# Patient Record
Sex: Male | Born: 1945 | Race: Black or African American | Hispanic: No | Marital: Married | Smoking: Former smoker
Health system: Southern US, Community
[De-identification: ages and names within clinical notes are randomized; demographics above are authoritative.]

---

## 2003-08-20 ENCOUNTER — Ambulatory Visit (HOSPITAL_COMMUNITY): Admission: RE | Admit: 2003-08-20 | Discharge: 2003-08-20 | Payer: Self-pay | Admitting: Gastroenterology

## 2012-01-30 DIAGNOSIS — I10 Essential (primary) hypertension: Secondary | ICD-10-CM | POA: Insufficient documentation

## 2014-02-05 ENCOUNTER — Other Ambulatory Visit: Payer: Self-pay | Admitting: Internal Medicine

## 2014-02-05 ENCOUNTER — Ambulatory Visit
Admission: RE | Admit: 2014-02-05 | Discharge: 2014-02-05 | Disposition: A | Discharge status: Home / Self Care | Payer: Medicare Other | Source: Ambulatory Visit | Attending: Internal Medicine | Admitting: Internal Medicine

## 2014-02-05 DIAGNOSIS — R05 Cough: Secondary | ICD-10-CM

## 2014-02-05 DIAGNOSIS — R059 Cough, unspecified: Secondary | ICD-10-CM

## 2015-08-18 ENCOUNTER — Other Ambulatory Visit: Payer: Self-pay | Admitting: Internal Medicine

## 2015-08-18 ENCOUNTER — Ambulatory Visit
Admission: RE | Admit: 2015-08-18 | Discharge: 2015-08-18 | Disposition: A | Discharge status: Home / Self Care | Payer: Medicare Other | Source: Ambulatory Visit | Attending: Internal Medicine | Admitting: Internal Medicine

## 2015-08-18 DIAGNOSIS — R52 Pain, unspecified: Secondary | ICD-10-CM

## 2017-07-19 ENCOUNTER — Ambulatory Visit
Admission: RE | Admit: 2017-07-19 | Discharge: 2017-07-19 | Disposition: A | Discharge status: Home / Self Care | Payer: Medicare Other | Source: Ambulatory Visit | Attending: Internal Medicine | Admitting: Internal Medicine

## 2017-07-19 ENCOUNTER — Other Ambulatory Visit: Payer: Self-pay | Admitting: Internal Medicine

## 2017-07-19 DIAGNOSIS — M25562 Pain in left knee: Secondary | ICD-10-CM

## 2017-10-16 ENCOUNTER — Other Ambulatory Visit: Payer: Self-pay | Admitting: Internal Medicine

## 2017-10-16 ENCOUNTER — Ambulatory Visit
Admission: RE | Admit: 2017-10-16 | Discharge: 2017-10-16 | Disposition: A | Discharge status: Home / Self Care | Payer: Medicare Other | Source: Ambulatory Visit | Attending: Internal Medicine | Admitting: Internal Medicine

## 2017-10-16 DIAGNOSIS — T1490XA Injury, unspecified, initial encounter: Secondary | ICD-10-CM

## 2019-02-01 ENCOUNTER — Other Ambulatory Visit: Payer: Self-pay

## 2019-02-01 ENCOUNTER — Emergency Department (HOSPITAL_COMMUNITY): Payer: Medicare Other

## 2019-02-01 ENCOUNTER — Encounter (HOSPITAL_COMMUNITY): Payer: Self-pay

## 2019-02-01 ENCOUNTER — Emergency Department (HOSPITAL_COMMUNITY)
Admission: EM | Admit: 2019-02-01 | Discharge: 2019-02-01 | Disposition: A | Discharge status: Home / Self Care | Payer: Medicare Other | Attending: Emergency Medicine | Admitting: Emergency Medicine

## 2019-02-01 DIAGNOSIS — R000 Tachycardia, unspecified: Secondary | ICD-10-CM

## 2019-02-01 DIAGNOSIS — R079 Chest pain, unspecified: Secondary | ICD-10-CM | POA: Insufficient documentation

## 2019-02-01 LAB — BASIC METABOLIC PANEL
Anion gap: 10 (ref 5–15)
BUN: 17 mg/dL (ref 8–23)
CO2: 24 mmol/L (ref 22–32)
Calcium: 9.8 mg/dL (ref 8.9–10.3)
Chloride: 107 mmol/L (ref 98–111)
Creatinine, Ser: 1.17 mg/dL (ref 0.61–1.24)
GFR calc Af Amer: >60 mL/min (ref >60)
GFR calc non Af Amer: >60 mL/min (ref >60)
Glucose, Bld: 129 mg/dL — ABNORMAL HIGH (ref 70–99)
Potassium: 3.9 mmol/L (ref 3.5–5.1)
Sodium: 141 mmol/L (ref 135–145)

## 2019-02-01 LAB — CBC WITH DIFFERENTIAL/PLATELET
Abs Immature Granulocytes: 0.04 10*3/uL (ref 0.00–0.07)
Basophils Absolute: 0 10*3/uL (ref 0.0–0.1)
Basophils Relative: 0 %
Eosinophils Absolute: 0.1 10*3/uL (ref 0.0–0.5)
Eosinophils Relative: 1 %
HCT: 45.8 % (ref 39.0–52.0)
Hemoglobin: 14 g/dL (ref 13.0–17.0)
Immature Granulocytes: 0 %
Lymphocytes Relative: 19 %
Lymphs Abs: 2 10*3/uL (ref 0.7–4.0)
MCH: 29.2 pg (ref 26.0–34.0)
MCHC: 30.6 g/dL (ref 30.0–36.0)
MCV: 95.6 fL (ref 80.0–100.0)
Monocytes Absolute: 1.3 10*3/uL — ABNORMAL HIGH (ref 0.1–1.0)
Monocytes Relative: 12 %
Neutro Abs: 7.4 10*3/uL (ref 1.7–7.7)
Neutrophils Relative %: 68 %
Platelets: 204 10*3/uL (ref 150–400)
RBC: 4.79 MIL/uL (ref 4.22–5.81)
RDW: 13.6 % (ref 11.5–15.5)
WBC: 10.8 10*3/uL — ABNORMAL HIGH (ref 4.0–10.5)
nRBC: 0 % (ref 0.0–0.2)

## 2019-02-01 LAB — TROPONIN I (HIGH SENSITIVITY)
Troponin I (High Sensitivity): 45 ng/L — ABNORMAL HIGH (ref <18)
Troponin I (High Sensitivity): 39 ng/L — ABNORMAL HIGH (ref <18)

## 2019-02-01 MED ORDER — SODIUM CHLORIDE 0.9 % IV BOLUS
500.0000 mL | Freq: Once | INTRAVENOUS | Status: AC
  Administered 2019-02-01: 09:00:00 500 mL via INTRAVENOUS

## 2019-02-01 MED ORDER — IOHEXOL 350 MG/ML SOLN
75.0000 mL | Freq: Once | INTRAVENOUS | Status: AC | PRN
  Administered 2019-02-01: 75 mL via INTRAVENOUS

## 2019-02-01 MED ORDER — SODIUM CHLORIDE 0.9 % IV BOLUS
500.0000 mL | Freq: Once | INTRAVENOUS | Status: AC
  Administered 2019-02-01: 500 mL via INTRAVENOUS

## 2019-02-01 MED ORDER — ASPIRIN 81 MG PO CHEW
324.0000 mg | CHEWABLE_TABLET | Freq: Once | ORAL | Status: AC
  Administered 2019-02-01: 324 mg via ORAL
  Filled 2019-02-01: qty 4

## 2019-02-01 NOTE — Discharge Instructions (Signed)
It was strongly recommended that you stay in the hospital for further work-up of your chest pain.  We discussed that missed or delayed diagnosis of heart attack, heart disease, etc. could result in future heart attack, organ failure, and/or death.  If at any point you change your mind or get recurrent chest pain, please return to the hospital or call 911.  If you develop recurrent, continued, or worsening chest pain, shortness of breath, fever, vomiting, abdominal or back pain, or any other new/concerning symptoms then return to the ER for evaluation.

## 2019-02-01 NOTE — ED Triage Notes (Addendum)
This morning around 0100 pt started to have 8/10 upper chest pain that does not radiate. The pain has been consistent even after 500mg  tylenol. Denies N/V, SOB,  NAD, AxO 4.

## 2019-02-01 NOTE — ED Notes (Signed)
Patient transported to X-ray 

## 2019-02-01 NOTE — ED Provider Notes (Signed)
[facility] Provider Note   CSN: [id number] Arrival date & time: 02/01/19  0502    History   Chief Complaint Chief Complaint  Patient presents with  . Chest Pain    HPI Charles Munoz is a 73 y.o. male.  HPI: A 73 year old patient with a history of hypertension presents for evaluation of chest pain. Initial onset of pain was approximately 3-6 hours ago. The patient's chest pain is well-localized and is not worse with exertion. The patient's chest pain is middle- or left-sided, is not described as heaviness/pressure/tightness, is not sharp and does not radiate to the arms/jaw/neck. The patient does not complain of nausea and denies diaphoresis. The patient has no history of stroke, has no history of peripheral artery disease, has not smoked in the past 90 days, denies any history of treated diabetes, has no relevant family history of coronary artery disease (first degree relative at less than age 71), has no history of hypercholesterolemia and does not have an elevated BMI (>=30).   Patient presents to the ER for evaluation of chest pain.  Patient reports that the pain began at 1 AM.  Charles Munoz was not doing anything specifically when the pain started.  Charles Munoz reports a well-defined area in the left upper chest where the pain has been continuous since it started.  Charles Munoz has not identified anything that makes it better or worse.  Charles Munoz did take Tylenol without improvement.  No associated nausea, diaphoresis, shortness of breath.  No past history of heart problems.     History reviewed. No pertinent past medical history.  There are no active problems to display for this patient.   History reviewed. No pertinent surgical history.      Home Medications    Prior to Admission medications   Not on File    Family History History reviewed. No pertinent family history.  Social History Social History   Tobacco Use  . Smoking status: Not on file  Substance  Use Topics  . Alcohol use: Not on file  . Drug use: Not on file     Allergies   Patient has no allergy information on record.   Review of Systems Review of Systems  Cardiovascular: Positive for chest pain.  All other systems reviewed and are negative.    Physical Exam Updated Vital Signs BP (!) 146/91   Pulse (!) 113   Temp 98.5 F (36.9 C)   Resp (!) 29   Ht 6\' 2"  (1.88 m)   Wt 102.1 kg   SpO2 96%   BMI 28.89 kg/m   Physical Exam Vitals signs and nursing note reviewed.  Constitutional:      General: Charles Munoz is not in acute distress.    Appearance: Normal appearance. Charles Munoz is well-developed.  HENT:     Head: Normocephalic and atraumatic.     Right Ear: Hearing normal.     Left Ear: Hearing normal.     Nose: Nose normal.  Eyes:     Conjunctiva/sclera: Conjunctivae normal.     Pupils: Pupils are equal, round, and reactive to light.  Neck:     Musculoskeletal: Normal range of motion and neck supple.  Cardiovascular:     Rate and Rhythm: Regular rhythm.     Heart sounds: S1 normal and S2 normal. No murmur. No friction rub. No gallop.   Pulmonary:     Effort: Pulmonary effort is normal. No respiratory distress.     Breath sounds: Normal breath sounds.  Chest:  Chest wall: No tenderness.  Abdominal:     General: Bowel sounds are normal.     Palpations: Abdomen is soft.     Tenderness: There is no abdominal tenderness. There is no guarding or rebound. Negative signs include Murphy's sign and McBurney's sign.     Hernia: No hernia is present.  Musculoskeletal: Normal range of motion.  Skin:    General: Skin is warm and dry.     Findings: No rash.  Neurological:     Mental Status: Charles Munoz is alert and oriented to person, place, and time.     GCS: GCS eye subscore is 4. GCS verbal subscore is 5. GCS motor subscore is 6.     Cranial Nerves: No cranial nerve deficit.     Sensory: No sensory deficit.     Coordination: Coordination normal.  Psychiatric:        Speech:  Speech normal.        Behavior: Behavior normal.        Thought Content: Thought content normal.      ED Treatments / Results  Labs (all labs ordered are listed, but only abnormal results are displayed) Labs Reviewed  CBC WITH DIFFERENTIAL/PLATELET - Abnormal; Notable for the following components:      Result Value   WBC 10.8 (*)    Monocytes Absolute 1.3 (*)    All other components within normal limits  BASIC METABOLIC PANEL - Abnormal; Notable for the following components:   Glucose, Bld 129 (*)    All other components within normal limits  TROPONIN I (HIGH SENSITIVITY) - Abnormal; Notable for the following components:   Troponin I (High Sensitivity) 45 (*)    All other components within normal limits    EKG EKG Interpretation  Date/Time:  Friday February 01 2019 05:13:24 EDT Ventricular Rate:  126 PR Interval:    QRS Duration: 105 QT Interval:  316 QTC Calculation: 458 R Axis:   30 Text Interpretation:  Sinus tachycardia Borderline repolarization abnormality Baseline wander in lead(s) II III aVF V5 V6 No previous tracing Confirmed by Gilda CreasePollina, Magdelena Kinsella J [phone]([id number]) on 02/01/2019 5:45:33 AM   Radiology Dg Chest 2 View  Result Date: 02/01/2019 CLINICAL DATA:  Chest pain EXAM: CHEST - 2 VIEW COMPARISON:  10/16/2017 FINDINGS: Low volume chest with asymmetrically elevated left diaphragm and scarring/atelectasis at the bases. No edema, effusion, or pneumothorax likely normal heart size. IMPRESSION: Stable low volume chest with elevated left diaphragm and scarring/atelectasis. Electronically Signed   By: Marnee SpringJonathon  Watts M.D.   On: 02/01/2019 05:35    Procedures Procedures (including critical care time)  Medications Ordered in ED Medications  sodium chloride 0.9 % bolus 500 mL (has no administration in time range)  aspirin chewable tablet 324 mg (324 mg Oral Given 02/01/19 0520)     Initial Impression / Assessment and Plan / ED Course  I have reviewed the triage vital signs  and the nursing notes.  Pertinent labs & imaging results that were available during my care of the patient were reviewed by me and considered in my medical decision making (see chart for details).     HEAR Score: 4 Patient presents to the emergency department for evaluation of chest pain.  Patient had onset of pain at rest several hours ago.  Pain is not altered by movement or exertion.  It is not reproducible with palpation.  Patient does not have any history of heart disease.  His only cardiac risk factor is hypertension.  EKG shows  sinus tachycardia with nonspecific changes.  Based on this Charles Munoz has a heart score of 4.  Tachycardia is unexplained at this point.  We will therefore have a high suspicion for possible PE, perform CT angiography.  First troponin slightly elevated at 45.  CT pending, will sign out to oncoming ER physician.   Final Clinical Impressions(s) / ED Diagnoses   Final diagnoses:  Chest pain, unspecified type    ED Discharge Orders    None       Orpah Greek, MD 02/01/19 [phone]

## 2019-02-01 NOTE — ED Notes (Signed)
EDP at bedside  

## 2019-02-01 NOTE — ED Provider Notes (Signed)
Care transferred to me.  CTA negative.  He is still mildly tachycardic in the low 100s.  With his heart score, gray zone troponin, nonspecific ECG changes I recommended he be admitted to the hospital for observation and ACS work-up.  He understands what I am worried about and potential deleterious effects of not coming in but wants to go home.  He states he is feeling much better and the chest pain is minimal.  I stressed multiple times he should probably come in and we had a back-and-forth discussion but he still declines and wants to go home.  He understands return precautions and was encouraged to return if at any point he changes his mind or his symptoms continue or worsen.  Results for orders placed or performed during the hospital encounter of 02/01/19  CBC with Differential/Platelet  Result Value Ref Range   WBC 10.8 (H) 4.0 - 10.5 K/uL   RBC 4.79 4.22 - 5.81 MIL/uL   Hemoglobin 14.0 13.0 - 17.0 g/dL   HCT 29.545.8 62.139.0 - 30.852.0 %   MCV 95.6 80.0 - 100.0 fL   MCH 29.2 26.0 - 34.0 pg   MCHC 30.6 30.0 - 36.0 g/dL   RDW 65.713.6 84.611.5 - 96.215.5 %   Platelets 204 150 - 400 K/uL   nRBC 0.0 0.0 - 0.2 %   Neutrophils Relative % 68 %   Neutro Abs 7.4 1.7 - 7.7 K/uL   Lymphocytes Relative 19 %   Lymphs Abs 2.0 0.7 - 4.0 K/uL   Monocytes Relative 12 %   Monocytes Absolute 1.3 (H) 0.1 - 1.0 K/uL   Eosinophils Relative 1 %   Eosinophils Absolute 0.1 0.0 - 0.5 K/uL   Basophils Relative 0 %   Basophils Absolute 0.0 0.0 - 0.1 K/uL   Immature Granulocytes 0 %   Abs Immature Granulocytes 0.04 0.00 - 0.07 K/uL  Basic metabolic panel  Result Value Ref Range   Sodium 141 135 - 145 mmol/L   Potassium 3.9 3.5 - 5.1 mmol/L   Chloride 107 98 - 111 mmol/L   CO2 24 22 - 32 mmol/L   Glucose, Bld 129 (H) 70 - 99 mg/dL   BUN 17 8 - 23 mg/dL   Creatinine, Ser 9.521.17 0.61 - 1.24 mg/dL   Calcium 9.8 8.9 - 84.110.3 mg/dL   GFR calc non Af Amer >60 >60 mL/min   GFR calc Af Amer >60 >60 mL/min   Anion gap 10 5 - 15   Troponin I (High Sensitivity)  Result Value Ref Range   Troponin I (High Sensitivity) 45 (H) <18 ng/L  Troponin I (High Sensitivity)  Result Value Ref Range   Troponin I (High Sensitivity) 39 (H) <18 ng/L   Dg Chest 2 View  Result Date: 02/01/2019 CLINICAL DATA:  Chest pain EXAM: CHEST - 2 VIEW COMPARISON:  10/16/2017 FINDINGS: Low volume chest with asymmetrically elevated left diaphragm and scarring/atelectasis at the bases. No edema, effusion, or pneumothorax likely normal heart size. IMPRESSION: Stable low volume chest with elevated left diaphragm and scarring/atelectasis. Electronically Signed   By: Marnee SpringJonathon  Watts M.D.   On: 02/01/2019 05:35   Ct Angio Chest Pe W Or Wo Contrast  Result Date: 02/01/2019 CLINICAL DATA:  Nonradiating chest pain EXAM: CT ANGIOGRAPHY CHEST WITH CONTRAST TECHNIQUE: Multidetector CT imaging of the chest was performed using the standard protocol during bolus administration of intravenous contrast. Multiplanar CT image reconstructions and MIPs were obtained to evaluate the vascular anatomy. CONTRAST:  75mL OMNIPAQUE IOHEXOL 350  MG/ML SOLN COMPARISON:  Chest x-ray earlier today FINDINGS: Cardiovascular: Satisfactory opacification of the pulmonary arteries to the segmental level. No evidence of pulmonary embolism. Normal heart size. No pericardial effusion. Mediastinum/Nodes: Negative for adenopathy Lungs/Pleura: Very low volume chest with atelectasis and/or scarring at the bases. There is no edema, consolidation, effusion, or pneumothorax. Upper Abdomen: Negative Musculoskeletal: No acute or aggressive finding Review of the MIP images confirms the above findings. IMPRESSION: 1. Negative for pulmonary embolism or other acute finding. 2. Chronic low volume chest with asymmetric elevation of the left diaphragm. Question diaphragmatic weakness. Electronically Signed   By: Monte Fantasia M.D.   On: 02/01/2019 07:43      Sherwood Gambler, MD 02/01/19 [phone]

## 2019-03-13 ENCOUNTER — Other Ambulatory Visit: Payer: Self-pay | Admitting: Internal Medicine

## 2019-03-13 DIAGNOSIS — R260 Ataxic gait: Secondary | ICD-10-CM

## 2019-03-13 DIAGNOSIS — R42 Dizziness and giddiness: Secondary | ICD-10-CM

## 2019-03-15 ENCOUNTER — Ambulatory Visit
Admission: RE | Admit: 2019-03-15 | Discharge: 2019-03-15 | Disposition: A | Discharge status: Home / Self Care | Payer: Medicare Other | Source: Ambulatory Visit | Attending: Internal Medicine | Admitting: Internal Medicine

## 2019-03-15 ENCOUNTER — Other Ambulatory Visit: Payer: Self-pay

## 2019-03-15 DIAGNOSIS — R260 Ataxic gait: Secondary | ICD-10-CM

## 2019-03-15 DIAGNOSIS — R42 Dizziness and giddiness: Secondary | ICD-10-CM

## 2020-01-06 ENCOUNTER — Encounter: Payer: Self-pay | Admitting: Neurology

## 2020-01-06 ENCOUNTER — Other Ambulatory Visit: Payer: Self-pay

## 2020-01-06 ENCOUNTER — Ambulatory Visit: Payer: Medicare Other | Admitting: Neurology

## 2020-01-06 VITALS — BP 147/87 | HR 99 | Ht 74.0 in | Wt 220.0 lb

## 2020-01-06 DIAGNOSIS — F5104 Psychophysiologic insomnia: Secondary | ICD-10-CM | POA: Diagnosis not present

## 2020-01-06 DIAGNOSIS — R42 Dizziness and giddiness: Secondary | ICD-10-CM | POA: Diagnosis not present

## 2020-01-06 MED ORDER — TRAZODONE HCL 50 MG PO TABS: 50.0000 mg | ORAL_TABLET | Freq: Every evening | ORAL | 5 refills | Status: DC | PRN

## 2020-01-06 NOTE — Progress Notes (Signed)
SLEEP MEDICINE CLINIC    Provider:  Melvyn Novas, MD  Primary Care Physician:  Burton Apley, MD [address] [place] [address] [place] [address]     Referring Provider: Burton Apley, Md [address] [place],  [place] [address]          Chief Complaint according to patient   Patient presents with:    . New Patient (Initial Visit)     pt complains of not being able to sleep at night. he states that he averages about 3-4 hrs at night but even that is broken. unsure if snores in sleep. never had a SS completed. pt presents today stating that last aug he developed dizzy spells. states that when it first began it was intermittent but now its constant. He doesnt associate this with position changes. states that sometimes when he started walking he feels as if he is falling forward. Marland Kitchen       HISTORY OF PRESENT ILLNESS:  Lakoda Mcanany is a 74 year- old African American male patient and seen here upon a referral on 01/06/2020 from PCP.   Chief concern according to patient :  I can't sleep more than 3 hours at night, I am dizzy.    I have the pleasure of seeing Ariana Cavenaugh today, a right-handed  [place] male with a possible sleep disorder. He  has no past medical history on file.     Family medical /sleep history: no other family member on CPAP with OSA, insomnia, sleep walkers. nobody with MS, PD but a sister has vertigo.    Social history:  Patient is retired from [employer] and lives in a household alone. Family status is widowed with 3 adult children.  The patient currently used to work in day and night shifts.  Pets are present.Tobacco use- quit 17 years ago. ETOH use - socially  Caffeine intake in form of Coffee( none ) Soda( once in a while ) Tea ( none ) or energy drinks. Regular exercise in form of -none    Sleep habits are as follows: The patient's dinner time is between 6 PM. Dizziness is always present when he stands up , gets up - and his sleep has been  interrupted since his wife passed 2015. The patient goes to bed at 9-10 PM and watches TV  In the bedroom- he continues to sleep for 2-3 hours, wakes for unknown reason- not bathroom breaks.    The preferred sleep position is sideways , with the support of 2 pillows.  Dreams are reportedly rare.  7.30 AM is the usual rise time. The patient wakes up spontaneously.  He reports not feeling refreshed or restored in AM, Naps are not taken.    Review of Systems: Out of a complete 14 system review, the patient complains of only the following symptoms, and all other reviewed systems are negative.:  , fragmented sleep, Insomnia - chronic- dizziness since 02-2019- no trauma, no medical condition before. No tinnitus, no nausea.    How likely are you to doze in the following situations: 0 = not likely, 1 = slight chance, 2 = moderate chance, 3 = high chance   Sitting and Reading? Watching Television? Sitting inactive in a public place (theater or meeting)? As a passenger in a car for an hour without a break? Lying down in the afternoon when circumstances permit? Sitting and talking to someone? Sitting quietly after lunch without alcohol? In a car, while stopped for a few minutes  in traffic?   Total =7/ 24 points   FSS endorsed at 18/ 63 points.   Social History   Socioeconomic History  . Marital status: Married    Spouse name: Not on file  . Number of children: Not on file  . Years of education: Not on file  . Highest education level: Not on file  Occupational History  . Not on file  Tobacco Use  . Smoking status: Former Research scientist (life sciences)  . Smokeless tobacco: Never Used  . Tobacco comment: Quit 16 yrs ago  Substance and Sexual Activity  . Alcohol use: Yes    Comment: he will have some drinks on the week  . Drug use: Not Currently  . Sexual activity: Not on file  Other Topics Concern  . Not on file  Social History Narrative  . Not on file   Social Determinants of Health   Financial  Resource Strain:   . Difficulty of Paying Living Expenses:   Food Insecurity:   . Worried About Charity fundraiser in the Last Year:   . Arboriculturist in the Last Year:   Transportation Needs:   . Film/video editor (Medical):   Marland Kitchen Lack of Transportation (Non-Medical):   Physical Activity:   . Days of Exercise per Week:   . Minutes of Exercise per Session:   Stress:   . Feeling of Stress :   Social Connections:   . Frequency of Communication with Friends and Family:   . Frequency of Social Gatherings with Friends and Family:   . Attends Religious Services:   . Active Member of Clubs or Organizations:   . Attends Archivist Meetings:   Marland Kitchen Marital Status:     Family History  Problem Relation Age of Onset  . Migraines Sister     No past medical history on file.  No past surgical history on file.   Current Outpatient Medications on File Prior to Visit  Medication Sig Dispense Refill  . amLODipine (NORVASC) 5 MG tablet Take 5 mg by mouth daily.    Marland Kitchen lisinopril (ZESTRIL) 5 MG tablet Take 5 mg by mouth daily.    . metFORMIN (GLUCOPHAGE) 500 MG tablet Take 500 mg by mouth daily.    . sildenafil (REVATIO) 20 MG tablet Take 20 mg by mouth daily as needed (ED).     . simvastatin (ZOCOR) 20 MG tablet Take 20 mg by mouth daily.     No current facility-administered medications on file prior to visit.    No Known Allergies  Physical exam:  Today's Vitals   01/06/20 1352 01/06/20 1353 01/06/20 1354  BP: (!) 154/96 139/87 (!) 147/87  Pulse: 98 96 99  Weight: 220 lb (99.8 kg)    Height: 6\' 2"  (1.88 m)     Body mass index is 28.25 kg/m.   Wt Readings from Last 3 Encounters:  01/06/20 220 lb (99.8 kg)  02/01/19 225 lb (102.1 kg)     Ht Readings from Last 3 Encounters:  01/06/20 6\' 2"  (1.88 m)  02/01/19 6\' 2"  (1.88 m)     We obtained orthostatic BP: Supine blood pressure was 154/96 mmHg with a regular heart rate of 98 bpm  Seated blood pressure was  139/87 mmHg with a heart rate of 94 standing blood pressure was 147/87 with a heart rate of 99.  These are normal responses is a variability of less than 20 mmHg and a heart rate that adapted minimallly to the change .  HTN present today.     General: The patient is awake, alert and appears not in acute distress. The patient is well groomed. Head: Normocephalic, atraumatic. Neck is supple. Mallampati 3 plus ,  neck circumference:16.5  inches . Nasal airflow  patent.  Retrognathia is  seen. Scalloped tongue.   Cardiovascular:  Regular rate and cardiac rhythm by pulse,  without distended neck veins. Respiratory: Lungs are clear to auscultation.  Skin:  Without evidence of ankle edema, or rash. Trunk: The patient's posture is erect.   Neurologic exam : The patient is awake and alert, oriented to place and time.   Memory subjective described as intact.  Attention span & concentration ability appears normal.  Speech is fluent,  without  dysarthria, dysphonia or aphasia.  Mood and affect are appropriate.   Cranial nerves: no loss of smell or taste reported  Pupils are equal and briskly reactive to light. Funduscopic exam deferred. .  Extraocular movements in vertical and horizontal planes were intact and without nystagmus. No Diplopia. Visual fields by finger perimetry are intact. Hearing was intact to soft voice and finger rubbing.  Facial sensation intact to fine touch. Facial motor strength is symmetric and tongue and uvula move midline.  Neck ROM : rotation, tilt and flexion extension were normal for age and shoulder shrug was symmetrical.    Motor exam:  Symmetric bulk, tone and ROM.   Normal tone without cog wheeling, symmetric grip strength .   Sensory:  Fine touch, pinprick and vibration were tested  and  normal.  Proprioception tested in the upper extremities was normal.   Coordination: Rapid alternating movements in the fingers/hands were of normal speed.  The Finger-to-nose  maneuver was intact without evidence of ataxia, dysmetria or tremor.   Gait and station: Patient could rise unassisted from a seated position, walked without assistive device.  Stance is of normal width/ base and the patient turned with 3 steps.  Toe and heel walk were deferred.  Deep tendon reflexes: in the  upper and lower extremities are symmetric, brisk and intact.  Babinski response was deferred.        After spending a total time of  46  minutes face to face and additional time for physical and neurologic examination, review of laboratory studies,  personal review of imaging studies, reports and results of other testing and review of referral information / records as far as provided in visit, I have established the following assessments:  1) chronic insomnia, sleep habits are improvable- watch TV before you go to sleep, not in the bedroom.  2)  He is likely a snorer, given his airway anatomy and neck size.  3) dizziness can be related to heart rate fixation.  I did not see a betablocker on med list- unsure where this may have originated. Patient had  Not well hydrated - this may be a part of the problem.    My Plan is to proceed with:  1)  Hydration  2)  HST/ PSG  for apnea screening 3) chronic insomnia may imropve on trazodone.   The patient had no labs or imaging studies available to review on epic.  He had a CT head which returned reportedly negative. CT IMPRESSION: No acute intracranial normality.  Mild cortical atrophy.  I would like to obtain a MRI brain to evaluate cerebellum.    I would like to thank Burton Apley, MD and Burton Apley, Md [address] [place],  [place] [address] for allowing me  to meet with and to take care of this pleasant patient.   In short, Antwon Rochin will  follow up  through our NP within 3 month.   CC: I will share my notes with PCP   Electronically signed by: Melvyn Novas, MD 01/06/2020 2:19 PM  [facility]  and [facility] Board certified by The [employer] of Sleep Medicine and Diplomate of the [employer] of Sleep Medicine. Board certified In Neurology through the ABPN, Fellow of the [employer] of Neurology. Medical Director of [facility].

## 2020-01-06 NOTE — Addendum Note (Signed)
Addended by: Melvyn Novas on: 01/06/2020 02:51 PM   Modules accepted: Orders

## 2020-01-06 NOTE — Patient Instructions (Signed)
Dizziness Dizziness is a common problem. It is a feeling of unsteadiness or light-headedness. You may feel like you are about to faint. Dizziness can lead to injury if you stumble or fall. Anyone can become dizzy, but dizziness is more common in older adults. This condition can be caused by a number of things, including medicines, dehydration, or illness. Follow these instructions at home: Eating and drinking  Drink enough fluid to keep your urine clear or pale yellow. This helps to keep you from becoming dehydrated. Try to drink more clear fluids, such as water.  Do not drink alcohol.  Limit your caffeine intake if told to do so by your health care provider. Check ingredients and nutrition facts to see if a food or beverage contains caffeine.  Limit your salt (sodium) intake if told to do so by your health care provider. Check ingredients and nutrition facts to see if a food or beverage contains sodium. Activity  Avoid making quick movements. ? Rise slowly from chairs and steady yourself until you feel okay. ? In the morning, first sit up on the side of the bed. When you feel okay, stand slowly while you hold onto something until you know that your balance is fine.  If you need to stand in one place for a long time, move your legs often. Tighten and relax the muscles in your legs while you are standing.  Do not drive or use heavy machinery if you feel dizzy.  Avoid bending down if you feel dizzy. Place items in your home so that they are easy for you to reach without leaning over. Lifestyle  Do not use any products that contain nicotine or tobacco, such as cigarettes and e-cigarettes. If you need help quitting, ask your health care provider.  Try to reduce your stress level by using methods such as yoga or meditation. Talk with your health care provider if you need help to manage your stress. General instructions  Watch your dizziness for any changes.  Take over-the-counter and  prescription medicines only as told by your health care provider. Talk with your health care provider if you think that your dizziness is caused by a medicine that you are taking.  Tell a friend or a family member that you are feeling dizzy. If he or she notices any changes in your behavior, have this person call your health care provider.  Keep all follow-up visits as told by your health care provider. This is important. Contact a health care provider if:  Your dizziness does not go away.  Your dizziness or light-headedness gets worse.  You feel nauseous.  You have reduced hearing.  You have new symptoms.  You are unsteady on your feet or you feel like the room is spinning. Get help right away if:  You vomit or have diarrhea and are unable to eat or drink anything.  You have problems talking, walking, swallowing, or using your arms, hands, or legs.  You feel generally weak.  You are not thinking clearly or you have trouble forming sentences. It may take a friend or family member to notice this.  You have chest pain, abdominal pain, shortness of breath, or sweating.  Your vision changes.  You have any bleeding.  You have a severe headache.  You have neck pain or a stiff neck.  You have a fever. These symptoms may represent a serious problem that is an emergency. Do not wait to see if the symptoms will go away. Get medical help   right away. Call your local emergency services (911 in the [place] Do not drive yourself to the hospital. Summary  Dizziness is a feeling of unsteadiness or light-headedness. This condition can be caused by a number of things, including medicines, dehydration, or illness.  Anyone can become dizzy, but dizziness is more common in older adults.  Drink enough fluid to keep your urine clear or pale yellow. Do not drink alcohol.  Avoid making quick movements if you feel dizzy. Monitor your dizziness for any changes. This information is not intended to  replace advice given to you by your health care provider. Make sure you discuss any questions you have with your health care provider. Document Revised: 07/07/2017 Document Reviewed: 08/06/2016 Elsevier Patient Education  2020 [employer]. Quality Sleep Information, Adult Quality sleep is important for your mental and physical health. It also improves your quality of life. Quality sleep means you:  Are asleep for most of the time you are in bed.  Fall asleep within 30 minutes.  Wake up no more than once a night.  Are awake for no longer than 20 minutes if you do wake up during the night. Most adults need 7-8 hours of quality sleep each night. How can poor sleep affect me? If you do not get enough quality sleep, you may have:  Mood swings.  Daytime sleepiness.  Confusion.  Decreased reaction time.  Sleep disorders, such as insomnia and sleep apnea.  Difficulty with: ? Solving problems. ? Coping with stress. ? Paying attention. These issues may affect your performance and productivity at work, school, and at home. Lack of sleep may also put you at higher risk for accidents, suicide, and risky behaviors. If you do not get quality sleep you may also be at higher risk for several health problems, including:  Infections.  Type 2 diabetes.  Heart disease.  High blood pressure.  Obesity.  Worsening of long-term conditions, like arthritis, kidney disease, depression, Parkinson's disease, and epilepsy. What actions can I take to get more quality sleep?      Stick to a sleep schedule. Go to sleep and wake up at about the same time each day. Do not try to sleep less on weekdays and make up for lost sleep on weekends. This does not work.  Try to get about 30 minutes of exercise on most days. Do not exercise 2-3 hours before going to bed.  Limit naps during the day to 30 minutes or less.  Do not use any products that contain nicotine or tobacco, such as cigarettes or  e-cigarettes. If you need help quitting, ask your health care provider.  Do not drink caffeinated beverages for at least 8 hours before going to bed. Coffee, tea, and some sodas contain caffeine.  Do not drink alcohol close to bedtime.  Do not eat large meals close to bedtime.  Do not take naps in the late afternoon.  Try to get at least 30 minutes of sunlight every day. Morning sunlight is best.  Make time to relax before bed. Reading, listening to music, or taking a hot bath promotes quality sleep.  Make your bedroom a place that promotes quality sleep. Keep your bedroom dark, quiet, and at a comfortable room temperature. Make sure your bed is comfortable. Take out sleep distractions like TV, a computer, smartphone, and bright lights.  If you are lying awake in bed for longer than 20 minutes, get up and do a relaxing activity until you feel sleepy.  Work with your  health care provider to treat medical conditions that may affect sleeping, such as: ? Nasal obstruction. ? Snoring. ? Sleep apnea and other sleep disorders.  Talk to your health care provider if you think any of your prescription medicines may cause you to have difficulty falling or staying asleep.  If you have sleep problems, talk with a sleep consultant. If you think you have a sleep disorder, talk with your health care provider about getting evaluated by a specialist. Where to find more information  [place] website: https://sleepfoundation.org  National Heart, Lung, and [place] ([place]): http://www.saunders.info/.pdf  Centers for Disease Control and Prevention (CDC): LearningDermatology.pl Contact a health care provider if you:  Have trouble getting to sleep or staying asleep.  Often wake up very early in the morning and cannot get back to sleep.  Have daytime sleepiness.  Have daytime sleep attacks of suddenly falling asleep and sudden muscle  weakness (narcolepsy).  Have a tingling sensation in your legs with a strong urge to move your legs (restless legs syndrome).  Stop breathing briefly during sleep (sleep apnea).  Think you have a sleep disorder or are taking a medicine that is affecting your quality of sleep. Summary  Most adults need 7-8 hours of quality sleep each night.  Getting enough quality sleep is an important part of health and well-being.  Make your bedroom a place that promotes quality sleep and avoid things that may cause you to have poor sleep, such as alcohol, caffeine, smoking, and large meals.  Talk to your health care provider if you have trouble falling asleep or staying asleep. This information is not intended to replace advice given to you by your health care provider. Make sure you discuss any questions you have with your health care provider. Document Revised: 10/11/2017 Document Reviewed: 10/11/2017 Elsevier Patient Education  [place].

## 2020-01-07 ENCOUNTER — Telehealth: Payer: Self-pay | Admitting: Neurology

## 2020-01-07 LAB — COMPREHENSIVE METABOLIC PANEL
ALT: 22 IU/L (ref 0–44)
AST: 27 IU/L (ref 0–40)
Albumin/Globulin Ratio: 1.4 (ref 1.2–2.2)
Albumin: 4.3 g/dL (ref 3.7–4.7)
Alkaline Phosphatase: 74 IU/L (ref 48–121)
BUN/Creatinine Ratio: 16 (ref 10–24)
BUN: 21 mg/dL (ref 8–27)
Bilirubin Total: 0.6 mg/dL (ref 0.0–1.2)
CO2: 25 mmol/L (ref 20–29)
Calcium: 9.1 mg/dL (ref 8.6–10.2)
Chloride: 105 mmol/L (ref 96–106)
Creatinine, Ser: 1.29 mg/dL — ABNORMAL HIGH (ref 0.76–1.27)
GFR calc Af Amer: 64 mL/min/{1.73_m2} (ref >59)
GFR calc non Af Amer: 55 mL/min/{1.73_m2} — ABNORMAL LOW (ref >59)
Globulin, Total: 3.1 g/dL (ref 1.5–4.5)
Glucose: 103 mg/dL — ABNORMAL HIGH (ref 65–99)
Potassium: 5.1 mmol/L (ref 3.5–5.2)
Sodium: 142 mmol/L (ref 134–144)
Total Protein: 7.4 g/dL (ref 6.0–8.5)

## 2020-01-07 LAB — CBC WITH DIFFERENTIAL/PLATELET
Basophils Absolute: 0.1 10*3/uL (ref 0.0–0.2)
Basos: 1 %
EOS (ABSOLUTE): 0.1 10*3/uL (ref 0.0–0.4)
Eos: 1 %
Hematocrit: 41.9 % (ref 37.5–51.0)
Hemoglobin: 13.7 g/dL (ref 13.0–17.7)
Immature Grans (Abs): 0 10*3/uL (ref 0.0–0.1)
Immature Granulocytes: 0 %
Lymphocytes Absolute: 1.3 10*3/uL (ref 0.7–3.1)
Lymphs: 16 %
MCH: 29.1 pg (ref 26.6–33.0)
MCHC: 32.7 g/dL (ref 31.5–35.7)
MCV: 89 fL (ref 79–97)
Monocytes Absolute: 1 10*3/uL — ABNORMAL HIGH (ref 0.1–0.9)
Monocytes: 12 %
Neutrophils Absolute: 5.8 10*3/uL (ref 1.4–7.0)
Neutrophils: 70 %
Platelets: 232 10*3/uL (ref 150–450)
RBC: 4.7 x10E6/uL (ref 4.14–5.80)
RDW: 12.4 % (ref 11.6–15.4)
WBC: 8.2 10*3/uL (ref 3.4–10.8)

## 2020-01-07 NOTE — Telephone Encounter (Signed)
-----   Message from Melvyn Novas, MD sent at 01/07/2020 11:59 AM EDT ----- Normal CBC diff, increased creatinine level, but the reported reduced GFR has to be adjusted for AA race. He is not renal impaired based on that modifier.

## 2020-01-07 NOTE — Telephone Encounter (Signed)
[employer] medicare order sent to [facility] no auth.  01/06/20 1st attempt [facility] Epic Order [employer]

## 2020-01-07 NOTE — Telephone Encounter (Signed)
Called the patient to review the lab results. There was no answer LVM asking for the patient to call back since DPR not yet scanned.    If patient calls back please advise that lab work was in normal range. Dr Dohmeier didn't see anything concerning regarding the lab findings.

## 2020-01-07 NOTE — Progress Notes (Signed)
Normal CBC diff, increased creatinine level, but the reported reduced GFR has to be adjusted for AA race. He is not renal impaired based on that modifier.

## 2020-01-08 NOTE — Telephone Encounter (Signed)
FYI pt called and the message from [facility] was read to him.  Pt had no questions.

## 2020-01-13 ENCOUNTER — Telehealth: Payer: Self-pay

## 2020-01-13 NOTE — Telephone Encounter (Signed)
Called to schedule sleep study. 

## 2020-01-14 ENCOUNTER — Telehealth: Payer: Self-pay

## 2020-01-14 NOTE — Telephone Encounter (Signed)
Rec'd vm for pt needing to cancel 7/5 sleep study. LVM on 6/29 to reschedule that appt

## 2020-06-09 ENCOUNTER — Other Ambulatory Visit: Payer: Self-pay

## 2020-06-09 ENCOUNTER — Encounter (INDEPENDENT_AMBULATORY_CARE_PROVIDER_SITE_OTHER): Payer: Self-pay | Admitting: Otolaryngology

## 2020-06-09 ENCOUNTER — Ambulatory Visit (INDEPENDENT_AMBULATORY_CARE_PROVIDER_SITE_OTHER): Payer: Medicare Other | Admitting: Otolaryngology

## 2020-06-09 VITALS — Temp 96.6°F

## 2020-06-09 DIAGNOSIS — M270 Developmental disorders of jaws: Secondary | ICD-10-CM | POA: Diagnosis not present

## 2020-06-09 NOTE — Progress Notes (Signed)
HPI: Charles Munoz is a 74 y.o. male who presents is referred by his PCP for evaluation of nodules that have developed inside his mouth on the inside of the mandible.  He has had these for years but they have multiplied and gotten a little bit larger.  They are otherwise asymptomatic.Marland Kitchen  No past medical history on file. No past surgical history on file. Social History   Socioeconomic History  . Marital status: Married    Spouse name: Not on file  . Number of children: Not on file  . Years of education: Not on file  . Highest education level: Not on file  Occupational History  . Not on file  Tobacco Use  . Smoking status: Former Smoker    Quit date: 2001    Years since quitting: 20.9  . Smokeless tobacco: Never Used  . Tobacco comment: Quit 20years ago  Substance and Sexual Activity  . Alcohol use: Yes    Comment: he will have some drinks on the week  . Drug use: Not Currently  . Sexual activity: Not on file  Other Topics Concern  . Not on file  Social History Narrative  . Not on file   Social Determinants of Health   Financial Resource Strain:   . Difficulty of Paying Living Expenses: Not on file  Food Insecurity:   . Worried About Programme researcher, broadcasting/film/video in the Last Year: Not on file  . Ran Out of Food in the Last Year: Not on file  Transportation Needs:   . Lack of Transportation (Medical): Not on file  . Lack of Transportation (Non-Medical): Not on file  Physical Activity:   . Days of Exercise per Week: Not on file  . Minutes of Exercise per Session: Not on file  Stress:   . Feeling of Stress : Not on file  Social Connections:   . Frequency of Communication with Friends and Family: Not on file  . Frequency of Social Gatherings with Friends and Family: Not on file  . Attends Religious Services: Not on file  . Active Member of Clubs or Organizations: Not on file  . Attends Banker Meetings: Not on file  . Marital Status: Not on file   Family History   Problem Relation Age of Onset  . Migraines Sister    No Known Allergies Prior to Admission medications   Medication Sig Start Date End Date Taking? Authorizing Provider  amLODipine (NORVASC) 5 MG tablet Take 5 mg by mouth daily. 01/02/19  Yes [provider]  lisinopril (ZESTRIL) 5 MG tablet Take 5 mg by mouth daily. 01/02/19  Yes [provider]  metFORMIN (GLUCOPHAGE) 500 MG tablet Take 500 mg by mouth daily. 01/02/19  Yes [provider]  sildenafil (REVATIO) 20 MG tablet Take 20 mg by mouth daily as needed (ED).  09/30/18  Yes [provider]  simvastatin (ZOCOR) 20 MG tablet Take 20 mg by mouth daily. 01/02/19  Yes [provider]  traZODone (DESYREL) 50 MG tablet Take 1 tablet (50 mg total) by mouth at bedtime as needed for sleep. 01/06/20  Yes Dohmeier, Porfirio Mylar, MD     Positive ROS: Otherwise negative  All other systems have been reviewed and were otherwise negative with the exception of those mentioned in the HPI and as above.  Physical Exam: Constitutional: Alert, well-appearing, no acute distress Ears: External ears without lesions or tenderness. Ear canals are clear bilaterally with intact, clear TMs.  Nasal: External nose without lesions.Marland Kitchen  Clear nasal passages Oral: Lips and gums without lesions. Tongue and palate mucosa without lesions. Posterior oropharynx clear.  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  These are benign with normal mucosa overlying them. Neck: No palpable adenopathy or masses Respiratory: Breathing comfortably  Skin: No facial/neck lesions or rash noted.  Procedures  Assessment: Torus mandibularis.  These are benign and do not need to be removed unless they become symptomatic.  Plan: He will follow-up as needed.   Narda Bonds, MD   CC:

## 2020-08-20 ENCOUNTER — Telehealth: Payer: Self-pay | Admitting: Neurology

## 2020-08-20 ENCOUNTER — Ambulatory Visit: Payer: Medicare Other | Admitting: Neurology

## 2020-08-20 ENCOUNTER — Other Ambulatory Visit: Payer: Self-pay

## 2020-08-20 ENCOUNTER — Encounter: Payer: Self-pay | Admitting: Neurology

## 2020-08-20 VITALS — BP 138/82 | HR 84 | Ht 74.0 in | Wt 222.0 lb

## 2020-08-20 DIAGNOSIS — R2681 Unsteadiness on feet: Secondary | ICD-10-CM | POA: Diagnosis not present

## 2020-08-20 DIAGNOSIS — G609 Hereditary and idiopathic neuropathy, unspecified: Secondary | ICD-10-CM | POA: Diagnosis not present

## 2020-08-20 DIAGNOSIS — F5104 Psychophysiologic insomnia: Secondary | ICD-10-CM

## 2020-08-20 DIAGNOSIS — R42 Dizziness and giddiness: Secondary | ICD-10-CM | POA: Diagnosis not present

## 2020-08-20 MED ORDER — TRAZODONE HCL 50 MG PO TABS: 25.0000 mg | ORAL_TABLET | Freq: Every evening | ORAL | 0 refills | Status: DC | PRN

## 2020-08-20 NOTE — Telephone Encounter (Signed)
[employer] medicare order sent to [facility]. No auth they will reach out to the patient to schedule.  

## 2020-08-20 NOTE — Progress Notes (Signed)
SLEEP MEDICINE CLINIC    Provider:  Melvyn Novas, MD  Primary Care Physician:  Burton Apley, MD [address] [place] [address] [place] [address]     Referring Provider: Burton Apley, Md [address] [place],  [place] [address]          Chief Complaint according to patient   Patient presents with:    . New Patient (Initial Visit)     pt complains of not being able to sleep at night. he states that he averages about 3-4 hrs at night but even that is broken. unsure if snores in sleep. never had a SS completed. pt presents today stating that last aug he developed dizzy spells. states that when it first began it was intermittent but now its constant. He doesnt associate this with position changes. states that sometimes when he started walking he feels as if he is falling forward.   08-20-2020: Presents for follow up r/t dizziness he has had ongoing. Patient did not complete MRI or SS that were ordered in June. He does indicate the dizziness has worsened and states that only when standing/walking. No dizziness with sitting.       HISTORY OF PRESENT ILLNESS:  Charles Munoz is a 75 year- old African American male patient and seen here in a RV on  08/20/2020 ; The patient feels worse than when lat seen, more dizziness. Sleep aid has worked for him , but there were no refills, he never had the sleep study, never had the ordered MRI. He insists he had a "brain scan" through his regular doctor- I can't see any results in EPIC. Patient not sure what was done by who and when.   Dizziness gets worse when he starts walking, not when he rises.   .   Chief concern on 03-14-2020: Sleep Consultation, according to patient :  I can't sleep more than 3 hours at night, I am dizzy.    I have the pleasure of seeing Charles Munoz today, a right-handed  [place] male with a possible sleep disorder. He  has no past medical history on file. Family medical /sleep history: no other family member  on CPAP with OSA, insomnia, sleep walkers. nobody with MS, PD but a sister has vertigo.    Social history:  Patient is retired from [employer] and lives in a household alone. Family status is widowed with 3 adult children.  The patient currently used to work in day and night shifts.  Pets are present.Tobacco use- quit 17 years ago. ETOH use - socially  Caffeine intake in form of Coffee( none ) Soda( once in a while ) Tea ( none ) or energy drinks. Regular exercise in form of -none    Sleep habits are as follows: The patient's dinner time is between 6 PM. Dizziness is always present when he stands up , gets up - and his sleep has been interrupted since his wife passed 2015. The patient goes to bed at 9-10 PM and watches TV  In the bedroom- he continues to sleep for 2-3 hours, wakes for unknown reason- not bathroom breaks.    The preferred sleep position is sideways , with the support of 2 pillows.  Dreams are reportedly rare.  7.30 AM is the usual rise time. The patient wakes up spontaneously.  He reports not feeling refreshed or restored in AM, Naps are not taken.    Review of Systems: Out of a complete 14 system review, the patient  complains of only the following symptoms, and all other reviewed systems are negative.:  , fragmented sleep, Insomnia - chronic- dizziness since 02-2019- no trauma, no medical condition before. No tinnitus, no nausea.    How likely are you to doze in the following situations: 0 = not likely, 1 = slight chance, 2 = moderate chance, 3 = high chance   Sitting and Reading? Watching Television? Sitting inactive in a public place (theater or meeting)? As a passenger in a car for an hour without a break? Lying down in the afternoon when circumstances permit? Sitting and talking to someone? Sitting quietly after lunch without alcohol? In a car, while stopped for a few minutes in traffic?   Total =9/ 24 points   FSS endorsed at 21/ 63 points.   Social History    Socioeconomic History  . Marital status: Married    Spouse name: Not on file  . Number of children: Not on file  . Years of education: Not on file  . Highest education level: Not on file  Occupational History  . Not on file  Tobacco Use  . Smoking status: Former Smoker    Quit date: 2001    Years since quitting: 21.1  . Smokeless tobacco: Never Used  . Tobacco comment: Quit 20years ago  Substance and Sexual Activity  . Alcohol use: Yes    Comment: he will have some drinks on the week  . Drug use: Not Currently  . Sexual activity: Not on file  Other Topics Concern  . Not on file  Social History Narrative  . Not on file   Social Determinants of Health   Financial Resource Strain: Not on file  Food Insecurity: Not on file  Transportation Needs: Not on file  Physical Activity: Not on file  Stress: Not on file  Social Connections: Not on file    Family History  Problem Relation Age of Onset  . Migraines Sister     No past medical history on file.  No past surgical history on file.   Current Outpatient Medications on File Prior to Visit  Medication Sig Dispense Refill  . diltiazem (CARDIZEM CD) 120 MG 24 hr capsule Take 120 mg by mouth daily.    Marland Kitchen levothyroxine (SYNTHROID) 50 MCG tablet Take 50 mcg by mouth daily.    Marland Kitchen lisinopril (ZESTRIL) 5 MG tablet Take 5 mg by mouth daily.    . metFORMIN (GLUCOPHAGE) 500 MG tablet Take 500 mg by mouth daily.    . sildenafil (REVATIO) 20 MG tablet Take 20 mg by mouth daily as needed (ED).     . simvastatin (ZOCOR) 20 MG tablet Take 20 mg by mouth daily.     No current facility-administered medications on file prior to visit.    No Known Allergies  Physical exam:  Today's Vitals   08/20/20 1014  BP: 138/82  Pulse: 84  Weight: 222 lb (100.7 kg)  Height: 6\' 2"  (1.88 m)   Body mass index is 28.5 kg/m.   Wt Readings from Last 3 Encounters:  08/20/20 222 lb (100.7 kg)  01/06/20 220 lb (99.8 kg)  02/01/19 225 lb  (102.1 kg)     Ht Readings from Last 3 Encounters:  08/20/20 6\' 2"  (1.88 m)  01/06/20 6\' 2"  (1.88 m)  02/01/19 6\' 2"  (1.88 m)     We obtained orthostatic BP: 03-14-2020. Supine blood pressure was 154/96 mmHg with a regular heart rate of 98 bpm Seated blood pressure was 139/87 mmHg  with a heart rate of 94 standing blood pressure was 147/87 with a heart rate of 99.  These are normal responses is a variability of less than 20 mmHg and a heart rate that adapted minimallly to the change .  HTN present today.    General: The patient is awake, alert and appears not in acute distress. The patient is well groomed. Head: Normocephalic, atraumatic. Neck is supple. Mallampati 3 plus ,  neck circumference:16.5  inches . Nasal airflow  patent.  Retrognathia is seen. Scalloped tongue.   Cardiovascular:  Regular rate and cardiac rhythm by pulse,  without distended neck veins. Respiratory: Lungs are clear to auscultation.  Skin:  Without evidence of ankle edema, or rash. Trunk: The patient's posture is erect.   Neurologic exam : The patient is awake and alert, oriented to place and time.   Memory subjective described as impaired.   Attention span & concentration ability appears limited.  Speech is fluent,  without  dysarthria, dysphonia or aphasia.  Mood and affect are appropriate.   Cranial nerves: no loss of smell or taste reported  Pupils are equal and briskly reactive to light. Funduscopic exam without pallor or edema.   Extraocular movements in vertical and horizontal planes were intact and without nystagmus. No Diplopia. Visual fields by finger perimetry are intact. Hearing was intact to soft voice and finger rubbing.   Facial sensation intact to fine touch. Facial motor strength is symmetric . his tongue and uvula move midline.  Neck ROM : rotation, tilt and flexion extension were normal for age and shoulder shrug was symmetrical.    Motor exam:  Symmetric bulk, tone and ROM.   Normal  tone without cog wheeling, symmetric grip strength .   Sensory:  Fine touch and vibration were absent at the ankles and below.  Proprioception tested in the upper extremities was normal.   Coordination: Rapid alternating movements in the fingers/hands were of normal speed.  The Finger-to-nose maneuver was intact without evidence of ataxia, dysmetria or tremor.   Gait and station: Patient could rise unassisted from a seated position, walked without assistive device.  Stance is of normal width/ base and the patient turned with 3.5 steps. He felt swaying to the side as he turned , and took an extra step to stabilize.  Toe and heel walk were deferred.  Deep tendon reflexes: in the  upper and lower extremities are symmetric, brisk and intact.  Babinski response was deferred.        After spending a total time of 20  minutes face to face and additional time for physical and neurologic examination, review of laboratory studies,  personal review of imaging studies, reports and results of other testing and review of referral information / records as far as provided in visit, I have established the following assessments:  1) chronic insomnia, sleep habits are improvable- watch TV before you go to sleep, not in the bedroom. Trazodone refilled.  2)  He is likely a snorer, given his airway anatomy and neck size. I asked him again to be screened for OSA.  3) dizziness is not orthostatic when he feels off balance as he is already walking-  can be related to heart rate fixation. He has to hold on to rails when descending stairs.    Patient had today been well hydrated - and had normal HR and BP.   The patient had no labs or imaging studies available to review on epic.  He had a CT  head which returned reportedly negative. CT IMPRESSION: No acute intracranial normality.  Mild cortical atrophy.  I would still like to obtain a MRI brain to evaluate cerebellum and vertebro-basilar flow.  HST or PSG. Trazodone  50 mg prn. Referral to PT for gait stabilization.     I would like to thank Burton Apley, MD and Burton Apley, Md [address] [place],  [place] [address] for allowing me to meet with and to take care of this pleasant patient.   In short, Sian Joles will  follow up  through our NP within 3-4 month.   CC: I will share my notes with PCP   Electronically signed by: Melvyn Novas, MD 08/20/2020 10:35 AM  [facility] Board certified by The [employer] of Sleep Medicine and Diplomate of the [employer] of Sleep Medicine. Board certified In Neurology through the ABPN, Fellow of the [employer] of Neurology. Medical Director of [facility].

## 2020-08-20 NOTE — Patient Instructions (Signed)
Fall Prevention in the Home, Adult Falls can cause injuries and can happen to people of all ages. There are many things you can do to make your home safe and to help prevent falls. Ask for help when making these changes. What actions can I take to prevent falls? General Instructions  Use good lighting in all rooms. Replace any light bulbs that burn out.  Turn on the lights in dark areas. Use night-lights.  Keep items that you use often in easy-to-reach places. Lower the shelves around your home if needed.  Set up your furniture so you have a clear path. Avoid moving your furniture around.  Do not have throw rugs or other things on the floor that can make you trip.  Avoid walking on wet floors.  If any of your floors are uneven, fix them.  Add color or contrast paint or tape to clearly mark and help you see: ? Grab bars or handrails. ? First and last steps of staircases. ? Where the edge of each step is.  If you use a stepladder: ? Make sure that it is fully opened. Do not climb a closed stepladder. ? Make sure the sides of the stepladder are locked in place. ? Ask someone to hold the stepladder while you use it.  Know where your pets are when moving through your home. What can I do in the bathroom?  Keep the floor dry. Clean up any water on the floor right away.  Remove soap buildup in the tub or shower.  Use nonskid mats or decals on the floor of the tub or shower.  Attach bath mats securely with double-sided, nonslip rug tape.  If you need to sit down in the shower, use a plastic, nonslip stool.  Install grab bars by the toilet and in the tub and shower. Do not use towel bars as grab bars.      What can I do in the bedroom?  Make sure that you have a light by your bed that is easy to reach.  Do not use any sheets or blankets for your bed that hang to the floor.  Have a firm chair with side arms that you can use for support when you get dressed. What can I do in  the kitchen?  Clean up any spills right away.  If you need to reach something above you, use a step stool with a grab bar.  Keep electrical cords out of the way.  Do not use floor polish or wax that makes floors slippery. What can I do with my stairs?  Do not leave any items on the stairs.  Make sure that you have a light switch at the top and the bottom of the stairs.  Make sure that there are handrails on both sides of the stairs. Fix handrails that are broken or loose.  Install nonslip stair treads on all your stairs.  Avoid having throw rugs at the top or bottom of the stairs.  Choose a carpet that does not hide the edge of the steps on the stairs.  Check carpeting to make sure that it is firmly attached to the stairs. Fix carpet that is loose or worn. What can I do on the outside of my home?  Use bright outdoor lighting.  Fix the edges of walkways and driveways and fix any cracks.  Remove anything that might make you trip as you walk through a door, such as a raised step or threshold.  Trim any   bushes or trees on paths to your home.  Check to see if handrails are loose or broken and that both sides of all steps have handrails.  Install guardrails along the edges of any raised decks and porches.  Clear paths of anything that can make you trip, such as tools or rocks.  Have leaves, snow, or ice cleared regularly.  Use sand or salt on paths during winter.  Clean up any spills in your garage right away. This includes grease or oil spills. What other actions can I take?  Wear shoes that: ? Have a low heel. Do not wear high heels. ? Have rubber bottoms. ? Feel good on your feet and fit well. ? Are closed at the toe. Do not wear open-toe sandals.  Use tools that help you move around if needed. These include: ? Canes. ? Walkers. ? Scooters. ? Crutches.  Review your medicines with your doctor. Some medicines can make you feel dizzy. This can increase your chance  of falling. Ask your doctor what else you can do to help prevent falls. Where to find more information  Centers for Disease Control and Prevention, [employer]: FootballExhibition.com.br  [employer] on Aging: https://walker.com/ Contact a doctor if:  You are afraid of falling at home.  You feel weak, drowsy, or dizzy at home.  You fall at home. Summary  There are many simple things that you can do to make your home safe and to help prevent falls.  Ways to make your home safe include removing things that can make you trip and installing grab bars in the bathroom.  Ask for help when making these changes in your home. This information is not intended to replace advice given to you by your health care provider. Make sure you discuss any questions you have with your health care provider. Document Revised: 02/05/2020 Document Reviewed: 02/05/2020 Elsevier Patient Education  2021 Elsevier Inc. Trazodone Tablets What is this medicine? TRAZODONE (TRAZ oh done) is used to treat depression. This medicine may be used for other purposes; ask your health care provider or pharmacist if you have questions. COMMON BRAND NAME(S): Desyrel What should I tell my health care provider before I take this medicine? They need to know if you have any of these conditions:  attempted suicide or thinking about it  bipolar disorder  bleeding problems  glaucoma  heart disease, or previous heart attack  irregular heart beat  kidney or liver disease  low levels of sodium in the blood  an unusual or allergic reaction to trazodone, other medicines, foods, dyes or preservatives  pregnant or trying to get pregnant  breast-feeding How should I use this medicine? Take this medicine by mouth with a glass of water. Follow the directions on the prescription label. Take this medicine shortly after a meal or a light snack. Take your medicine at regular intervals. Do not take your medicine more often than directed. Do not  stop taking this medicine suddenly except upon the advice of your doctor. Stopping this medicine too quickly may cause serious side effects or your condition may worsen. A special MedGuide will be given to you by the pharmacist with each prescription and refill. Be sure to read this information carefully each time. Talk to your pediatrician regarding the use of this medicine in children. Special care may be needed. Overdosage: If you think you have taken too much of this medicine contact a poison control center or emergency room at once. NOTE: This medicine is only for  you. Do not share this medicine with others. What if I miss a dose? If you miss a dose, take it as soon as you can. If it is almost time for your next dose, take only that dose. Do not take double or extra doses. What may interact with this medicine? Do not take this medicine with any of the following medications:  certain medicines for fungal infections like fluconazole, itraconazole, ketoconazole, posaconazole, voriconazole  cisapride  dronedarone  linezolid  MAOIs like Carbex, Eldepryl, Marplan, Nardil, and Parnate  mesoridazine  methylene blue (injected into a vein)  pimozide  saquinavir  thioridazine This medicine may also interact with the following medications:  alcohol  antiviral medicines for HIV or AIDS  aspirin and aspirin-like medicines  barbiturates like phenobarbital  certain medicines for blood pressure, heart disease, irregular heart beat  certain medicines for depression, anxiety, or psychotic disturbances  certain medicines for migraine headache like almotriptan, eletriptan, frovatriptan, naratriptan, rizatriptan, sumatriptan, zolmitriptan  certain medicines for seizures like carbamazepine and phenytoin  certain medicines for sleep  certain medicines that treat or prevent blood clots like dalteparin, enoxaparin, warfarin  digoxin  fentanyl  lithium  NSAIDS, medicines for pain  and inflammation, like ibuprofen or naproxen  other medicines that prolong the QT interval (cause an abnormal heart rhythm) like dofetilide  rasagiline  supplements like St. John's wort, kava kava, valerian  tramadol  tryptophan This list may not describe all possible interactions. Give your health care provider a list of all the medicines, herbs, non-prescription drugs, or dietary supplements you use. Also tell them if you smoke, drink alcohol, or use illegal drugs. Some items may interact with your medicine. What should I watch for while using this medicine? Tell your doctor if your symptoms do not get better or if they get worse. Visit your doctor or health care professional for regular checks on your progress. Because it may take several weeks to see the full effects of this medicine, it is important to continue your treatment as prescribed by your doctor. Patients and their families should watch out for new or worsening thoughts of suicide or depression. Also watch out for sudden changes in feelings such as feeling anxious, agitated, panicky, irritable, hostile, aggressive, impulsive, severely restless, overly excited and hyperactive, or not being able to sleep. If this happens, especially at the beginning of treatment or after a change in dose, call your health care professional. Bonita Quin may get drowsy or dizzy. Do not drive, use machinery, or do anything that needs mental alertness until you know how this medicine affects you. Do not stand or sit up quickly, especially if you are an older patient. This reduces the risk of dizzy or fainting spells. Alcohol may interfere with the effect of this medicine. Avoid alcoholic drinks. This medicine may cause dry eyes and blurred vision. If you wear contact lenses you may feel some discomfort. Lubricating drops may help. See your eye doctor if the problem does not go away or is severe. Your mouth may get dry. Chewing sugarless gum, sucking hard candy and  drinking plenty of water may help. Contact your doctor if the problem does not go away or is severe. What side effects may I notice from receiving this medicine? Side effects that you should report to your doctor or health care professional as soon as possible:  allergic reactions like skin rash, itching or hives, swelling of the face, lips, or tongue  elevated mood, decreased need for sleep, racing thoughts, impulsive behavior  confusion  fast, irregular heartbeat  feeling faint or lightheaded, falls  feeling agitated, angry, or irritable  loss of balance or coordination  painful or prolonged erections  restlessness, pacing, inability to keep still  suicidal thoughts or other mood changes  tremors  trouble sleeping  seizures  unusual bleeding or bruising Side effects that usually do not require medical attention (report to your doctor or health care professional if they continue or are bothersome):  change in sex drive or performance  change in appetite or weight  constipation  headache  muscle aches or pains  nausea This list may not describe all possible side effects. Call your doctor for medical advice about side effects. You may report side effects to FDA at 1-800-FDA-1088. Where should I keep my medicine? Keep out of the reach of children. Store at room temperature between 15 and 30 degrees C (59 to 86 degrees F). Protect from light. Keep container tightly closed. Throw away any unused medicine after the expiration date. NOTE: This sheet is a summary. It may not cover all possible information. If you have questions about this medicine, talk to your doctor, pharmacist, or health care provider.  2021 Elsevier/Gold Standard (2020-05-25 14:46:11)

## 2020-08-30 ENCOUNTER — Other Ambulatory Visit: Payer: Self-pay

## 2020-08-30 ENCOUNTER — Ambulatory Visit
Admission: RE | Admit: 2020-08-30 | Discharge: 2020-08-30 | Disposition: A | Discharge status: Home / Self Care | Payer: Medicare Other | Source: Ambulatory Visit | Attending: Neurology | Admitting: Neurology

## 2020-08-30 DIAGNOSIS — R2681 Unsteadiness on feet: Secondary | ICD-10-CM

## 2020-08-30 DIAGNOSIS — G609 Hereditary and idiopathic neuropathy, unspecified: Secondary | ICD-10-CM

## 2020-08-30 DIAGNOSIS — F5104 Psychophysiologic insomnia: Secondary | ICD-10-CM

## 2020-08-30 DIAGNOSIS — R42 Dizziness and giddiness: Secondary | ICD-10-CM

## 2020-08-30 MED ORDER — GADOBENATE DIMEGLUMINE 529 MG/ML IV SOLN
20.0000 mL | Freq: Once | INTRAVENOUS | Status: AC | PRN
  Administered 2020-08-30: 20 mL via INTRAVENOUS

## 2020-08-31 ENCOUNTER — Encounter: Payer: Self-pay | Admitting: Neurology

## 2020-08-31 NOTE — Progress Notes (Signed)
IMPRESSION: This MRI of the brain with and without contrast shows the following: 1.   Mild generalized cortical atrophy 2.   Some scattered T2/FLAIR hyperintense foci in the hemispheres and pons consistent with mild chronic microvascular ischemic change. 3.   No acute findings. 4.   Normal enhancement pattern.   INTERPRETING PHYSICIAN:  Richard A. Sater, MD, PhD, FAAN Certified in  Neuroimaging by [employer] of Neuroimaging   This means we have no neurological focal abnormality, mild non focal atrophy, no abnormalities in the brain stem or vestibular nerves. 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    Cc Dr Su Hilt

## 2020-09-01 ENCOUNTER — Telehealth: Payer: Self-pay | Admitting: Neurology

## 2020-09-01 NOTE — Telephone Encounter (Signed)
Called the patient to review MRI of the brain results.  There was no answer.  Left voicemail advising patient to call back (no dpr to leave detailed message).   **When pt returns call, please advise the MRI of the brain was normal in appearance.  There was no sign of stroke, tumor, or bleeding on the brain.  The image did show tiny changes in the small vessels of the brain, this is common as we age and did not concern Dr. Vickey Huger.  Overall normal findings

## 2020-09-01 NOTE — Telephone Encounter (Signed)
-----   Message from Melvyn Novas, MD sent at 08/31/2020 12:39 PM EST ----- IMPRESSION: This MRI of the brain with and without contrast shows the following: 1.   Mild generalized cortical atrophy 2.   Some scattered T2/FLAIR hyperintense foci in the hemispheres and pons consistent with mild chronic microvascular ischemic change. 3.   No acute findings. 4.   Normal enhancement pattern.   INTERPRETING PHYSICIAN:  Richard A. Sater, MD, PhD, FAAN Certified in  Neuroimaging by [employer]   This means we have no neurological focal abnormality, mild non focal atrophy, no abnormalities in the brain stem or vestibular nerves. 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    Cc Dr Su Hilt

## 2020-09-07 ENCOUNTER — Telehealth: Payer: Self-pay | Admitting: Neurology

## 2020-09-07 NOTE — Telephone Encounter (Signed)
Hi casey  Yes that would be great . His referral was under Review . [phone] [id number].    Type Date User Summary Attachment  General 08/21/2020 12:06 PM Botello, Deisy R - -  Note   Therapist reviewed referral , routing to [facility]

## 2020-09-07 NOTE — Telephone Encounter (Signed)
Patient walked into the lobby this morning, stating he has not heard back from MRI results. He was I informed they tried to call him on the 15th but no answer. The results were read back to him via the telephone message in the chart. He understood the information but states he is still feeling dizzy and would like a call back. Best call back is [phone]

## 2020-09-07 NOTE — Telephone Encounter (Signed)
Called the patient back and advised that Dr Vickey Huger had placed a referral to the physical therapy for vestibular rehab.  Referral was placed on 2/4 and is under review.  Provided the phone number for the neuro rehab and advised the patient to reach out if he has not heard anything soon.  Patient verbalized understanding.  We also will be discussed the MRI results since I have him on the phone as well.

## 2020-09-08 ENCOUNTER — Other Ambulatory Visit: Payer: Self-pay

## 2020-09-08 ENCOUNTER — Ambulatory Visit: Payer: Medicare Other | Attending: Neurology

## 2020-09-08 VITALS — BP 130/82 | HR 84

## 2020-09-08 DIAGNOSIS — R2681 Unsteadiness on feet: Secondary | ICD-10-CM | POA: Insufficient documentation

## 2020-09-08 DIAGNOSIS — R2689 Other abnormalities of gait and mobility: Secondary | ICD-10-CM | POA: Insufficient documentation

## 2020-09-08 DIAGNOSIS — R42 Dizziness and giddiness: Secondary | ICD-10-CM | POA: Insufficient documentation

## 2020-09-09 NOTE — Therapy (Signed)
[facility] [address] [place], [place], [address] Phone: [phone]   Fax:  [phone]  Physical Therapy Evaluation  Patient Details  Name: Charles Munoz MRN: [id number] Date of Birth: 01-Jan-1946 Referring Provider (PT): Porfirio Mylararmen Dohmeier   Encounter Date: 09/08/2020   PT End of Session - 09/08/20 1316    Visit Number 1    Number of Visits 9    Date for PT Re-Evaluation 11/08/20    Authorization Type [employer] medicare so 10th visit progress note    PT Start Time 1314    PT Stop Time 1356    PT Time Calculation (min) 42 min    Equipment Utilized During Treatment Gait belt    Activity Tolerance Patient tolerated treatment well;Other (comment)   reported lightheaded with walking only   Behavior [facility] for tasks assessed/performed           History reviewed. No pertinent past medical history.  History reviewed. No pertinent surgical history.  Vitals:   09/08/20 1329 09/08/20 1331  BP: 138/88 130/82  Pulse: 78 84      Subjective Assessment - 09/08/20 1316    Subjective Pt was referred for Intermittent lightheadedness, Chronic insomnia, Idiopathic peripheral neuropathy, Gait instability. Pt reports that biggest issue is he gets lightheaded when he is up walking every time. He denies any syncopal episodes and no issues when he is sitting or lying. Denies any room spinning but reports the lightheaded feeling stays constant. Only PMH is HTN. Pt reports it started about a year ago.    Pertinent History HTN    Diagnostic tests MRI on 08/29/20 This MRI of the brain with and without contrast shows the following:  1.   Mild generalized cortical atrophy  2.   Some scattered T2/FLAIR hyperintense foci in the hemispheres and pons consistent with mild chronic microvascular ischemic change.  3.   No acute findings.  4.   Normal enhancement pattern.    Patient Stated Goals Pt would like to be able to walk and not feel  lightheaded.    Currently in Pain? No/denies              [facility] - 09/08/20 1320      Assessment   Medical Diagnosis gait instability    Referring Provider (PT) Porfirio Mylararmen Dohmeier    Onset Date/Surgical Date 08/20/20    Hand Dominance Right      Precautions   Precautions Fall      Balance Screen   Has the patient fallen in the past 6 months No    Has the patient had a decrease in activity level because of a fear of falling?  Yes    Is the patient reluctant to leave their home because of a fear of falling?  Yes      Home Environment   Living Environment Private residence    Living Arrangements Alone    Type of Home House    Home Access Level entry    Home Layout Two level;Able to live on main level with bedroom/bathroom    Alternate Level Stairs-Number of Steps 12    Alternate Level Stairs-Rails None    Home Equipment Cane - single point    Additional Comments pt goes outside the house and down 2 steps on porch if has to go to the basement      Prior Function   Level of Independence Independent;Independent with community mobility without device    Vocation  Retired      Copy Status Within Functional Limits for tasks assessed      Sensation   Light Touch Appears Intact      ROM / Strength   AROM / PROM / Strength Strength      Strength   Overall Strength Comments Pt grossly 5/5 throughout BLE and BUE in ankle DF, knee flex/ext, hip flexion, shoulder flexion, elbow flex/ext      Ambulation/Gait   Ambulation/Gait Yes    Ambulation/Gait Assistance 5: Supervision    Ambulation/Gait Assistance Details Pt occasionally staggers crossing feet over but able to regain balance on own.    Assistive device None    Gait Pattern Step-through pattern;Narrow base of support    Gait velocity 8.67 sec=1.71m/s    Stairs Yes    Stairs Assistance 5: Supervision    Stair Management Technique Two rails;Alternating pattern    Number of Stairs 4     Height of Stairs 6      Functional Gait  Assessment   Gait assessed  Yes    Gait Level Surface Walks 20 ft in less than 5.5 sec, no assistive devices, good speed, no evidence for imbalance, normal gait pattern, deviates no more than 6 in outside of the 12 in walkway width.    Change in Gait Speed Able to change speed, demonstrates mild gait deviations, deviates 6-10 in outside of the 12 in walkway width, or no gait deviations, unable to achieve a major change in velocity, or uses a change in velocity, or uses an assistive device.    Gait with Horizontal Head Turns Performs head turns smoothly with no change in gait. Deviates no more than 6 in outside 12 in walkway width    Gait with Vertical Head Turns Performs head turns with no change in gait. Deviates no more than 6 in outside 12 in walkway width.    Gait and Pivot Turn Pivot turns safely within 3 sec and stops quickly with no loss of balance.    Step Over Obstacle Is able to step over one shoe box (4.5 in total height) without changing gait speed. No evidence of imbalance.    Gait with Narrow Base of Support Ambulates 4-7 steps.    Gait with Eyes Closed Walks 20 ft, uses assistive device, slower speed, mild gait deviations, deviates 6-10 in outside 12 in walkway width. Ambulates 20 ft in less than 9 sec but greater than 7 sec.    Ambulating Backwards Walks 20 ft, uses assistive device, slower speed, mild gait deviations, deviates 6-10 in outside 12 in walkway width.    Steps Alternating feet, must use rail.    Total Score 23    FGA comment: BP=132/78 after. Pt with constant 9/10 lightheaded feeling throughout walking tasks that resides immediately with sitting.                  Vestibular Assessment - 09/08/20 1336      Symptom Behavior   Type of Dizziness  Lightheadedness    Frequency of Dizziness everytime he is walking is constant lightheaded feeling rating 9/10 but not to point of fainting.      Oculomotor Exam   Oculomotor  Alignment Normal    Smooth Pursuits Intact    Saccades Intact      Positional Sensitivities   Nose to Right Knee No dizziness    Nose to Left Knee No dizziness    Positional Sensitivities Comments pt denies any  dizziness with rolling in bed              Objective measurements completed on examination: See above findings.               PT Education - 09/09/20 0816    Education Details Pt instructed on PT plan of care. Will focus on higher level balance and gait stability.    Person(s) Educated Patient    Methods Explanation    Comprehension Verbalized understanding            PT Short Term Goals - 09/09/20 2878      PT SHORT TERM GOAL #1   Title Pt will be independent with HEP for balance to continue gains on own.    Time 4    Period Weeks    Status New    Target Date 10/09/20      PT SHORT TERM GOAL #2   Title Pt will increase FGA from 23 to 25/30 or more for improved balance and gait safety.    Baseline 09/08/20 23/30    Time 4    Period Weeks    Status New    Target Date 10/09/20             PT Long Term Goals - 09/09/20 0837      PT LONG TERM GOAL #1   Title Pt will ambulate >1000' on varied surfaces independently for improved community mobility.    Time 8    Period Weeks    Status New    Target Date 11/08/20      PT LONG TERM GOAL #2   Title Pt will increase FGA from 23 to 27/30 or better for improved balance and gait safety.    Baseline 09/08/20 23/30    Time 8    Period Weeks    Status New    Target Date 11/08/20      PT LONG TERM GOAL #3   Title Pt will report <6/10 "lightheaded" feeling with gait for improved function.    Baseline 9/10 constant with gait    Time 8    Period Weeks    Status New    Target Date 11/08/20                  Plan - 09/09/20 0818    Clinical Impression Statement Pt is 75 y/o male who presents with chief complaint of feeling lightheaded only when walking. With vestibular assessment no  dizziness reported with good tracking on eye movements. Orthostatics were assessed and no issues noted with good HR response as well. Pt's strength is grossly 5/5. He does have some noted staggering at times with gait with FGA score of 23/30 indicating moderate fall risk. Pt's "lightheaded" feeling with all gait activities stayed a constant 9/10 and resided immediately with sitting. Was not affected by and turning activities. Pt will benefit from skilled PT to focus on balance and gait.    Personal Factors and Comorbidities Comorbidity 1    Comorbidities HTN    Examination-Activity Limitations Locomotion Level;Stairs    Examination-Participation Restrictions Community Activity;Yard Work    Conservation officer, historic buildings Evolving/Moderate complexity    Clinical Decision Making Moderate    Rehab Potential Good    PT Frequency 1x / week   plus eval   PT Duration 8 weeks    PT Treatment/Interventions ADLs/Self Care Home Management;DME Instruction;Therapeutic activities;Functional mobility training;Stair training;Gait training;Therapeutic exercise;Balance training;Neuromuscular re-education;Vestibular;Canalith Repostioning;Manual techniques    PT Next Visit  Plan Assess 6 min walk monitoring vitals to see if any change. Gait activities with high level balance challenges to see if changes "lightheaded" feeling at all. Establish initial balance HEP.    Consulted and Agree with Plan of Care Patient           Patient will benefit from skilled therapeutic intervention in order to improve the following deficits and impairments:  Abnormal gait,Decreased balance,Dizziness  Visit Diagnosis: Other abnormalities of gait and mobility  Unsteadiness on feet  Dizziness and giddiness     Problem List Patient Active Problem List   Diagnosis Date Noted  . Intermittent lightheadedness 08/20/2020  . Gait instability 08/20/2020  . Idiopathic peripheral neuropathy 08/20/2020  . Chronic insomnia  08/20/2020    Ronn Melena, PT, DPT, NCS 09/09/2020, 8:40 AM  [facility] [address] [place], [place], [address] Phone: [phone]   Fax:  [phone]  Name: Charles Munoz MRN: [id number] Date of Birth: June 10, 1946

## 2020-09-15 ENCOUNTER — Other Ambulatory Visit: Payer: Self-pay

## 2020-09-15 ENCOUNTER — Ambulatory Visit: Payer: Medicare Other | Attending: Neurology

## 2020-09-15 DIAGNOSIS — M6281 Muscle weakness (generalized): Secondary | ICD-10-CM | POA: Diagnosis present

## 2020-09-15 DIAGNOSIS — R2681 Unsteadiness on feet: Secondary | ICD-10-CM | POA: Insufficient documentation

## 2020-09-15 DIAGNOSIS — R2689 Other abnormalities of gait and mobility: Secondary | ICD-10-CM | POA: Insufficient documentation

## 2020-09-15 NOTE — Therapy (Signed)
[facility] [address] [place], [place], [address] Phone: [phone]   Fax:  [phone]  Physical Therapy Treatment  Patient Details  Name: Charles Munoz MRN: [id number] Date of Birth: 09-Dec-1945 Referring Provider (PT): Porfirio Mylar Dohmeier   Encounter Date: 09/15/2020   PT End of Session - 09/15/20 1017    Visit Number 2    Number of Visits 9    Date for PT Re-Evaluation 11/08/20    Authorization Type [employer] medicare so 10th visit progress note    PT Start Time 1016    PT Stop Time 1056    PT Time Calculation (min) 40 min    Equipment Utilized During Treatment Gait belt    Activity Tolerance Patient tolerated treatment well;Other (comment)   reported lightheaded with walking only   Behavior During Therapy [facility] for tasks assessed/performed           History reviewed. No pertinent past medical history.  History reviewed. No pertinent surgical history.  There were no vitals filed for this visit.   Subjective Assessment - 09/15/20 1017    Subjective Pt denies any changes. Still lightheaded feeling only when walking.    Pertinent History HTN    Diagnostic tests MRI on 08/29/20 This MRI of the brain with and without contrast shows the following:  1.   Mild generalized cortical atrophy  2.   Some scattered T2/FLAIR hyperintense foci in the hemispheres and pons consistent with mild chronic microvascular ischemic change.  3.   No acute findings.  4.   Normal enhancement pattern.    Patient Stated Goals Pt would like to be able to walk and not feel lightheaded.    Currently in Pain? No/denies              [facility] PT Assessment - 09/15/20 1018      6 Minute Walk- Baseline   6 Minute Walk- Baseline yes   denies any lightheaded feeling at rest   BP (mmHg) 128/72    HR (bpm) 82    02 Sat (%RA) 99 %    Modified Borg Scale for Dyspnea 0- Nothing at all      6 Minute walk- Post Test   6 Minute Walk Post Test yes    BP  (mmHg) 158/88    HR (bpm) 118    02 Sat (%RA) 99 %    Modified Borg Scale for Dyspnea 0- Nothing at all      6 minute walk test results    Aerobic Endurance Distance Walked 1150    Endurance additional comments lightheaded feeling 7-8/10 throughout walking which resided after 15 sec of sitting.                         [facility] Adult PT Treatment/Exercise - 09/15/20 1018      Ambulation/Gait   Ambulation/Gait Yes    Ambulation/Gait Assistance 5: Supervision    Ambulation/Gait Assistance Details Ambulated during 6 min walk. Pt has lateral lean to right at times with right knee valgus noted.    Assistive device None    Gait Pattern Step-through pattern;Narrow base of support      Neuro Re-ed    Neuro Re-ed Details  In // bars: marching gait 8' x 6 with cues to squeeze bottom for improved SLS stability especially on right. Side stepping with green theraband around thighs 8' x 6 with cues for upright posture and to control steps. Step-ups on  6" step with green theraband around thighs x 10 with RLE without UE support then switched to step-ups on rockerboard positioned an/tpost with RLE x 5 then added in tapping cone with LLE each time to further increase right SLS. Visual cues in mirror to help maintain level pelvis. Pt reported feeling pretty good after activities with no lightheadedness.                  PT Education - 09/15/20 1221    Education Details Initiated balance HEP    Person(s) Educated Patient    Methods Explanation;Demonstration;Handout    Comprehension Verbalized understanding;Returned demonstration            PT Short Term Goals - 09/09/20 0832      PT SHORT TERM GOAL #1   Title Pt will be independent with HEP for balance to continue gains on own.    Time 4    Period Weeks    Status New    Target Date 10/09/20      PT SHORT TERM GOAL #2   Title Pt will increase FGA from 23 to 25/30 or more for improved balance and gait safety.    Baseline  09/08/20 23/30    Time 4    Period Weeks    Status New    Target Date 10/09/20             PT Long Term Goals - 09/09/20 0837      PT LONG TERM GOAL #1   Title Pt will ambulate >1000' on varied surfaces independently for improved community mobility.    Time 8    Period Weeks    Status New    Target Date 11/08/20      PT LONG TERM GOAL #2   Title Pt will increase FGA from 23 to 27/30 or better for improved balance and gait safety.    Baseline 09/08/20 23/30    Time 8    Period Weeks    Status New    Target Date 11/08/20      PT LONG TERM GOAL #3   Title Pt will report <6/10 "lightheaded" feeling with gait for improved function.    Baseline 9/10 constant with gait    Time 8    Period Weeks    Status New    Target Date 11/08/20                 Plan - 09/15/20 1222    Clinical Impression Statement Pt had normal vital response during 6 min walk testing today. Maintained a constant 7-8/10 lightheadedness throughout walk which resided quickly with sitting. Focused on dynamic balance activities with pt denying and lightheaded activity throughout. As pt tends to have some pelvic drop with right SLS also focused on more SLS activities.    Personal Factors and Comorbidities Comorbidity 1    Comorbidities HTN    Examination-Activity Limitations Locomotion Level;Stairs    Examination-Participation Restrictions Community Activity;Yard Work    Conservation officer, historic buildings Evolving/Moderate complexity    Rehab Potential Good    PT Frequency 1x / week   plus eval   PT Duration 8 weeks    PT Treatment/Interventions ADLs/Self Care Home Management;DME Instruction;Therapeutic activities;Functional mobility training;Stair training;Gait training;Therapeutic exercise;Balance training;Neuromuscular re-education;Vestibular;Canalith Repostioning;Manual techniques    PT Next Visit Plan Gait activities with high level balance, SLS activities on RLE. How is initial balance HEP going.  Add more balance activities to it next session.    Consulted and Agree  with Plan of Care Patient           Patient will benefit from skilled therapeutic intervention in order to improve the following deficits and impairments:  Abnormal gait,Decreased balance,Dizziness  Visit Diagnosis: Other abnormalities of gait and mobility  Muscle weakness (generalized)     Problem List Patient Active Problem List   Diagnosis Date Noted  . Intermittent lightheadedness 08/20/2020  . Gait instability 08/20/2020  . Idiopathic peripheral neuropathy 08/20/2020  . Chronic insomnia 08/20/2020    Ronn Melena , PT, DPT, NCS 09/15/2020, 12:25 PM  [facility] [address] [place], [place], [address] Phone: [phone]   Fax:  [phone]  Name: Jaimin Krupka MRN: [id number] Date of Birth: 03/14/1946

## 2020-09-15 NOTE — Patient Instructions (Signed)
Access Code: [email or web address] URL: [email or web address] Date: 09/15/2020 Prepared by: Elmer Bales  Exercises Walking March - 2 x daily - 7 x weekly - 1 sets - 3-4 reps Side Stepping with Resistance at [employer] and Counter Support - 2 x daily - 7 x weekly - 1 sets - 3-4 reps

## 2020-09-22 ENCOUNTER — Ambulatory Visit: Payer: Medicare Other

## 2020-09-22 ENCOUNTER — Other Ambulatory Visit: Payer: Self-pay

## 2020-09-22 DIAGNOSIS — R2689 Other abnormalities of gait and mobility: Secondary | ICD-10-CM

## 2020-09-22 DIAGNOSIS — R2681 Unsteadiness on feet: Secondary | ICD-10-CM

## 2020-09-22 NOTE — Therapy (Signed)
[facility] [address] [place], [place], [address] Phone: [phone]   Fax:  [phone]  Physical Therapy Treatment  Patient Details  Name: Charles Munoz MRN: [id number] Date of Birth: 12-03-1945 Referring Provider (PT): Porfirio Mylar Dohmeier   Encounter Date: 09/22/2020   PT End of Session - 09/22/20 1017    Visit Number 3    Number of Visits 9    Date for PT Re-Evaluation 11/08/20    Authorization Type [employer] medicare so 10th visit progress note    PT Start Time 1017    PT Stop Time 1057    PT Time Calculation (min) 40 min    Equipment Utilized During Treatment Gait belt    Activity Tolerance Patient tolerated treatment well;Other (comment)   reported lightheaded with walking only   Behavior During Therapy [facility] for tasks assessed/performed           History reviewed. No pertinent past medical history.  History reviewed. No pertinent surgical history.  There were no vitals filed for this visit.   Subjective Assessment - 09/22/20 1017    Subjective Pt reports he is still having the lightheaded feeling a little when walking and occasionally when laying down. He reports that symptoms have not been as constant with walking and sometimes less severe as well.    Pertinent History HTN    Diagnostic tests MRI on 08/29/20 This MRI of the brain with and without contrast shows the following:  1.   Mild generalized cortical atrophy  2.   Some scattered T2/FLAIR hyperintense foci in the hemispheres and pons consistent with mild chronic microvascular ischemic change.  3.   No acute findings.  4.   Normal enhancement pattern.    Patient Stated Goals Pt would like to be able to walk and not feel lightheaded.    Currently in Pain? No/denies                             [facility] Adult PT Treatment/Exercise - 09/22/20 1020      Ambulation/Gait   Ambulation/Gait Yes    Ambulation/Gait Assistance 5: Supervision     Ambulation/Gait Assistance Details around in clinic between activities    Assistive device None    Gait Pattern Step-through pattern;Narrow base of support      Neuro Re-ed    Neuro Re-ed Details  At counter: marching gait without UE support 8' x 6, tandem gait 8' x 6, over blue mat: side stepping 8' x 6, reciprocal steps over 3 cones tapping cone first for increased SLS time x 8 bouts. Side stepping over 3 cones on blue mat x 6 bouts. Alternating toe taps on cone x 10 with cues to tighten bottom for erect posture especially on right. On blue mat: tandem stance 30 sec x 2 each position. Pt had to touch occasionally. Less stable with right foot posterior. Standing with feet together eyes closed with head turn left/right x 10. No instability of dizziness noted. Gait over obstacle coures: reciprocal steps over 4 varied height orange hurdles, weaving in and out of 4 cones x 2 laps through then side stepping over hurdles and reciprocal steps over cones with tapping first to increase SLS time CGA.                  PT Education - 09/22/20 1049    Education Details Pt reporting sore 3rd toe on right foot and may have  [place] toenail. Suggested seeing podiatrist if does not improve. Added to HEP    Person(s) Educated Patient    Methods Explanation;Demonstration;Handout    Comprehension Verbalized understanding;Returned demonstration            PT Short Term Goals - 09/09/20 9563      PT SHORT TERM GOAL #1   Title Pt will be independent with HEP for balance to continue gains on own.    Time 4    Period Weeks    Status New    Target Date 10/09/20      PT SHORT TERM GOAL #2   Title Pt will increase FGA from 23 to 25/30 or more for improved balance and gait safety.    Baseline 09/08/20 23/30    Time 4    Period Weeks    Status New    Target Date 10/09/20             PT Long Term Goals - 09/09/20 0837      PT LONG TERM GOAL #1   Title Pt will ambulate >1000' on varied surfaces  independently for improved community mobility.    Time 8    Period Weeks    Status New    Target Date 11/08/20      PT LONG TERM GOAL #2   Title Pt will increase FGA from 23 to 27/30 or better for improved balance and gait safety.    Baseline 09/08/20 23/30    Time 8    Period Weeks    Status New    Target Date 11/08/20      PT LONG TERM GOAL #3   Title Pt will report <6/10 "lightheaded" feeling with gait for improved function.    Baseline 9/10 constant with gait    Time 8    Period Weeks    Status New    Target Date 11/08/20                 Plan - 09/22/20 1202    Clinical Impression Statement Pt reporting some improvement in lightheaded symptoms since starting therapy as is now not all the time when walking. He denied any symptoms with balance activities today. PT continued to progress activities with more SLS. Most challenged on right.    Personal Factors and Comorbidities Comorbidity 1    Comorbidities HTN    Examination-Activity Limitations Locomotion Level;Stairs    Examination-Participation Restrictions Community Activity;Yard Work    Conservation officer, historic buildings Evolving/Moderate complexity    Rehab Potential Good    PT Frequency 1x / week   plus eval   PT Duration 8 weeks    PT Treatment/Interventions ADLs/Self Care Home Management;DME Instruction;Therapeutic activities;Functional mobility training;Stair training;Gait training;Therapeutic exercise;Balance training;Neuromuscular re-education;Vestibular;Canalith Repostioning;Manual techniques    PT Next Visit Plan Gait activities with high level balance, SLS activities on RLE.    Consulted and Agree with Plan of Care Patient           Patient will benefit from skilled therapeutic intervention in order to improve the following deficits and impairments:  Abnormal gait,Decreased balance,Dizziness  Visit Diagnosis: Other abnormalities of gait and mobility  Unsteadiness on feet     Problem  List Patient Active Problem List   Diagnosis Date Noted  . Intermittent lightheadedness 08/20/2020  . Gait instability 08/20/2020  . Idiopathic peripheral neuropathy 08/20/2020  . Chronic insomnia 08/20/2020    Ronn Melena, PT, DPT, NCS 09/22/2020, 12:04 PM  [facility] [phone]  [address] [place], [place], [address] Phone: [phone]   Fax:  [phone]  Name: Charles Munoz MRN: [id number] Date of Birth: [birth year]

## 2020-09-22 NOTE — Patient Instructions (Signed)
Access Code: [email or web address] URL: [email or web address] Date: 09/22/2020 Prepared by: Elmer Bales  Exercises Walking March - 2 x daily - 7 x weekly - 1 sets - 3-4 reps Side Stepping with Resistance at [employer] and Counter Support - 2 x daily - 7 x weekly - 1 sets - 3-4 reps Tandem Walking with Counter Support - 2 x daily - 7 x weekly - 1 sets - 3-4 reps Standing Tandem Balance with Counter Support - 2 x daily - 7 x weekly - 1 sets - 3 reps - 30 sec hold

## 2020-09-29 ENCOUNTER — Ambulatory Visit: Payer: Medicare Other

## 2020-09-29 ENCOUNTER — Other Ambulatory Visit: Payer: Self-pay

## 2020-09-29 VITALS — BP 148/90

## 2020-09-29 DIAGNOSIS — R2689 Other abnormalities of gait and mobility: Secondary | ICD-10-CM | POA: Diagnosis not present

## 2020-09-29 DIAGNOSIS — R2681 Unsteadiness on feet: Secondary | ICD-10-CM

## 2020-09-29 NOTE — Therapy (Signed)
[facility] [address] [place], [place], [address] Phone: [phone]   Fax:  [phone]  Physical Therapy Treatment  Patient Details  Name: Charles Munoz MRN: [id number] Date of Birth: April 04, 1946 Referring Provider (PT): Porfirio Mylar Dohmeier   Encounter Date: 09/29/2020   PT End of Session - 09/29/20 1018    Visit Number 4    Number of Visits 9    Date for PT Re-Evaluation 11/08/20    Authorization Type [employer] medicare so 10th visit progress note    PT Start Time 1017    PT Stop Time 1056    PT Time Calculation (min) 39 min    Equipment Utilized During Treatment Gait belt    Activity Tolerance Patient tolerated treatment well;Other (comment)   reported lightheaded with walking only   Behavior During Therapy [facility] for tasks assessed/performed           History reviewed. No pertinent past medical history.  History reviewed. No pertinent surgical history.  Vitals:   09/29/20 1026 09/29/20 1029  BP: (!) 148/90 (!) 148/90     Subjective Assessment - 09/29/20 1019    Subjective Pt reports that he is still having the dizzy spells when he is walking. He also gets them when he lies down. Reports is not room spinning, just lightheaded feeling still.    Pertinent History HTN    Diagnostic tests MRI on 08/29/20 This MRI of the brain with and without contrast shows the following:  1.   Mild generalized cortical atrophy  2.   Some scattered T2/FLAIR hyperintense foci in the hemispheres and pons consistent with mild chronic microvascular ischemic change.  3.   No acute findings.  4.   Normal enhancement pattern.    Patient Stated Goals Pt would like to be able to walk and not feel lightheaded.    Currently in Pain? No/denies                   Vestibular Assessment - 09/29/20 0001      Positional Testing   Dix-Hallpike Dix-Hallpike Right;Dix-Hallpike Left    Horizontal Canal Testing Horizontal Canal Right;Horizontal  Canal Left      Dix-Hallpike Right   Dix-Hallpike Right Symptoms No nystagmus      Dix-Hallpike Left   Dix-Hallpike Left Symptoms No nystagmus      Horizontal Canal Right   Horizontal Canal Right Symptoms Normal      Horizontal Canal Left   Horizontal Canal Left Symptoms Normal                    [facility] Adult PT Treatment/Exercise - 09/29/20 1030      Ambulation/Gait   Ambulation/Gait Yes    Ambulation/Gait Assistance 5: Supervision    Ambulation/Gait Assistance Details Pt was cued to focus on foot placement and try to stay up tall. Occasional stagger to the right. At end of session pt ambulated 230' with focus on foot placement and engaging core more. Noted improving in gait quality with no staggering during this.    Ambulation Distance (Feet) 850 Feet   230' inside at end   Assistive device None    Gait Pattern Step-through pattern;Narrow base of support    Ambulation Surface Level;Unlevel;Outdoor;Paved;Grass      Neuro Re-ed    Neuro Re-ed Details  Reciprocal stepping over floor ladder near counter x 2 bouts then with marching walk x 6 bouts. As pt fatigued he had more episodes of crossing over  needing occasional CGA/min assist to regain. After seated rest break, performed side stepping over floor ladder without UE support x 6. Tapping 3 cones on command x 10 each leg then alternating toe taps x 10 without UE support.                  PT Education - 09/29/20 1826    Education Details Pt to focus on foot placement with gait.    Person(s) Educated Patient    Comprehension Verbalized understanding            PT Short Term Goals - 09/09/20 9233      PT SHORT TERM GOAL #1   Title Pt will be independent with HEP for balance to continue gains on own.    Time 4    Period Weeks    Status New    Target Date 10/09/20      PT SHORT TERM GOAL #2   Title Pt will increase FGA from 23 to 25/30 or more for improved balance and gait safety.    Baseline 09/08/20  23/30    Time 4    Period Weeks    Status New    Target Date 10/09/20             PT Long Term Goals - 09/09/20 0837      PT LONG TERM GOAL #1   Title Pt will ambulate >1000' on varied surfaces independently for improved community mobility.    Time 8    Period Weeks    Status New    Target Date 11/08/20      PT LONG TERM GOAL #2   Title Pt will increase FGA from 23 to 27/30 or better for improved balance and gait safety.    Baseline 09/08/20 23/30    Time 8    Period Weeks    Status New    Target Date 11/08/20      PT LONG TERM GOAL #3   Title Pt will report <6/10 "lightheaded" feeling with gait for improved function.    Baseline 9/10 constant with gait    Time 8    Period Weeks    Status New    Target Date 11/08/20                 Plan - 09/29/20 1828    Clinical Impression Statement PT assessed positional vestibular tests and pt was negative. No nystagmus and no symptoms. Pt reports continued lightheaded feeling with gait mostly but occaionally when lies down now. Minimal complaints during session today. BP remains good. Continued to focus more on improving foot placement with balance activities as pt does stagger at times to the right but improving.    Personal Factors and Comorbidities Comorbidity 1    Comorbidities HTN    Examination-Activity Limitations Locomotion Level;Stairs    Examination-Participation Restrictions Community Activity;Yard Work    Conservation officer, historic buildings Evolving/Moderate complexity    Rehab Potential Good    PT Frequency 1x / week   plus eval   PT Duration 8 weeks    PT Treatment/Interventions ADLs/Self Care Home Management;DME Instruction;Therapeutic activities;Functional mobility training;Stair training;Gait training;Therapeutic exercise;Balance training;Neuromuscular re-education;Vestibular;Canalith Repostioning;Manual techniques    PT Next Visit Plan Check STGs. Bufford Lope- I wondered if you had any other ideas on why he is  "lightheaded" with gait. BP has been good, negative on positional testing. I have been unable to reproduce his symptoms other than just when walking. Balance activities with working on improving  SLS time.    Consulted and Agree with Plan of Care Patient           Patient will benefit from skilled therapeutic intervention in order to improve the following deficits and impairments:  Abnormal gait,Decreased balance,Dizziness  Visit Diagnosis: Other abnormalities of gait and mobility  Unsteadiness on feet     Problem List Patient Active Problem List   Diagnosis Date Noted  . Intermittent lightheadedness 08/20/2020  . Gait instability 08/20/2020  . Idiopathic peripheral neuropathy 08/20/2020  . Chronic insomnia 08/20/2020    Ronn Melena, PT, DPT, NCS 09/29/2020, 6:34 PM  [facility] [address] [place], [place], [address] Phone: [phone]   Fax:  [phone]  Name: Charles Munoz MRN: [id number] Date of Birth: Oct 01, 1945

## 2020-10-06 ENCOUNTER — Ambulatory Visit: Payer: Medicare Other | Admitting: Physical Therapy

## 2020-10-13 ENCOUNTER — Ambulatory Visit: Payer: Medicare Other

## 2020-10-20 ENCOUNTER — Ambulatory Visit: Payer: Medicare Other

## 2020-11-24 ENCOUNTER — Other Ambulatory Visit: Payer: Self-pay | Admitting: Neurology

## 2021-02-23 ENCOUNTER — Ambulatory Visit: Payer: Medicare Other | Admitting: Adult Health

## 2021-06-21 ENCOUNTER — Emergency Department (HOSPITAL_COMMUNITY)
Admission: EM | Admit: 2021-06-21 | Discharge: 2021-06-21 | Disposition: A | Discharge status: Home / Self Care | Payer: Medicare Other | Attending: Emergency Medicine | Admitting: Emergency Medicine

## 2021-06-21 ENCOUNTER — Emergency Department (HOSPITAL_COMMUNITY): Payer: Medicare Other

## 2021-06-21 DIAGNOSIS — Z87891 Personal history of nicotine dependence: Secondary | ICD-10-CM | POA: Insufficient documentation

## 2021-06-21 DIAGNOSIS — R42 Dizziness and giddiness: Secondary | ICD-10-CM | POA: Insufficient documentation

## 2021-06-21 LAB — CBC WITH DIFFERENTIAL/PLATELET
Abs Immature Granulocytes: 0.01 10*3/uL (ref 0.00–0.07)
Basophils Absolute: 0.1 10*3/uL (ref 0.0–0.1)
Basophils Relative: 1 %
Eosinophils Absolute: 0.1 10*3/uL (ref 0.0–0.5)
Eosinophils Relative: 2 %
HCT: 47.2 % (ref 39.0–52.0)
Hemoglobin: 14.6 g/dL (ref 13.0–17.0)
Immature Granulocytes: 0 %
Lymphocytes Relative: 24 %
Lymphs Abs: 1.5 10*3/uL (ref 0.7–4.0)
MCH: 29.3 pg (ref 26.0–34.0)
MCHC: 30.9 g/dL (ref 30.0–36.0)
MCV: 94.6 fL (ref 80.0–100.0)
Monocytes Absolute: 0.9 10*3/uL (ref 0.1–1.0)
Monocytes Relative: 14 %
Neutro Abs: 3.8 10*3/uL (ref 1.7–7.7)
Neutrophils Relative %: 59 %
Platelets: 213 10*3/uL (ref 150–400)
RBC: 4.99 MIL/uL (ref 4.22–5.81)
RDW: 13.1 % (ref 11.5–15.5)
WBC: 6.4 10*3/uL (ref 4.0–10.5)
nRBC: 0 % (ref 0.0–0.2)

## 2021-06-21 LAB — COMPREHENSIVE METABOLIC PANEL
ALT: 24 U/L (ref 0–44)
AST: 26 U/L (ref 15–41)
Albumin: 3.5 g/dL (ref 3.5–5.0)
Alkaline Phosphatase: 63 U/L (ref 38–126)
Anion gap: 5 (ref 5–15)
BUN: 12 mg/dL (ref 8–23)
CO2: 28 mmol/L (ref 22–32)
Calcium: 9.2 mg/dL (ref 8.9–10.3)
Chloride: 103 mmol/L (ref 98–111)
Creatinine, Ser: 1.17 mg/dL (ref 0.61–1.24)
GFR, Estimated: >60 mL/min (ref >60)
Glucose, Bld: 118 mg/dL — ABNORMAL HIGH (ref 70–99)
Potassium: 3.8 mmol/L (ref 3.5–5.1)
Sodium: 136 mmol/L (ref 135–145)
Total Bilirubin: 0.5 mg/dL (ref 0.3–1.2)
Total Protein: 7 g/dL (ref 6.5–8.1)

## 2021-06-21 LAB — MAGNESIUM: Magnesium: 2.1 mg/dL (ref 1.7–2.4)

## 2021-06-21 MED ORDER — MECLIZINE HCL 12.5 MG PO TABS: 12.5000 mg | ORAL_TABLET | Freq: Three times a day (TID) | ORAL | 0 refills | Status: DC | PRN

## 2021-06-21 NOTE — ED Triage Notes (Signed)
Pt. Stated, Charles Munoz been here cause of dizziness for the last 3 years everyday.

## 2021-06-21 NOTE — ED Provider Notes (Signed)
Emergency Medicine Provider Triage Evaluation Note  Charles Munoz , a 75 y.o. male  was evaluated in triage.  Pt complains of dizziness for the last 3 years, states he had normal CT scans and MRIs without improvement in his symptoms.  Has followed with neurology without resolution of his lightheadedness and disequilibrium when he walks.  No acute changes prompting his visit today.  Review of Systems  Positive: Lightheadedness and disequilibrium Negative: Blurry, double vision, nausea, vomiting, fevers, chills, chest pain, shortness of breath.  Physical Exam  BP 134/89 (BP Location: Left Arm)   Pulse 83   Temp 98.7 F (37.1 C)   Resp 16   SpO2 99%  Gen:   Awake, no distress  Resp:  Normal effort  MSK:   Moves extremities without difficulty  Other:  EOMI, PERRL, symmetric strength and sensation in the upper and lower extremities bilaterally.  Medical Decision Making  Medically screening exam initiated at 8:56 AM.  Appropriate orders placed.  Charles Munoz was informed that the remainder of the evaluation will be completed by another provider, this initial triage assessment does not replace that evaluation, and the importance of remaining in the ED until their evaluation is complete.  This chart was dictated using voice recognition software, Dragon. Despite the best efforts of this provider to proofread and correct errors, errors may still occur which can change documentation meaning.     Paris Lore, PA-C 06/21/21 3154    Melene Plan, DO 06/21/21 1218

## 2021-06-29 NOTE — ED Provider Notes (Signed)
[facility] EMERGENCY DEPARTMENT Provider Note   CSN: [id number] Arrival date & time: 06/21/21  0746     History Chief Complaint  Patient presents with   Dizziness    Charles Munoz is a 75 y.o. male.   Dizziness Associated symptoms: no chest pain and no shortness of breath   Patient with dizziness/unsteadiness.  Has had for years now.  Has had extensive work-up including therapy for it.  Definite cause not found.  Here because he is still having symptoms.  No acute changes.  Has lightheadedness.  Sometimes it comes with standing.  Sometimes not.  No vision changes.    No past medical history on file.  Patient Active Problem List   Diagnosis Date Noted   Intermittent lightheadedness 08/20/2020   Gait instability 08/20/2020   Idiopathic peripheral neuropathy 08/20/2020   Chronic insomnia 08/20/2020    No past surgical history on file.     Family History  Problem Relation Age of Onset   Migraines Sister     Social History   Tobacco Use   Smoking status: Former    Types: Cigarettes    Quit date: 2001    Years since quitting: 21.9   Smokeless tobacco: Never   Tobacco comments:    Quit 20years ago  Substance Use Topics   Alcohol use: Yes    Comment: he will have some drinks on the week   Drug use: Not Currently    Home Medications Prior to Admission medications   Medication Sig Start Date End Date Taking? Authorizing Provider  meclizine (ANTIVERT) 12.5 MG tablet Take 1 tablet (12.5 mg total) by mouth 3 (three) times daily as needed for dizziness. 06/21/21  Yes Benjiman Core, MD  diltiazem (CARDIZEM CD) 120 MG 24 hr capsule Take 120 mg by mouth daily. 05/28/20   [provider]  levothyroxine (SYNTHROID) 50 MCG tablet Take 50 mcg by mouth daily. 05/28/20   [provider]  lisinopril (ZESTRIL) 5 MG tablet Take 5 mg by mouth daily. 01/02/19   [provider]  metFORMIN (GLUCOPHAGE) 500 MG tablet Take 500 mg by  mouth daily. 01/02/19   [provider]  sildenafil (REVATIO) 20 MG tablet Take 20 mg by mouth daily as needed (ED).  09/30/18   [provider]  simvastatin (ZOCOR) 20 MG tablet Take 20 mg by mouth daily. 01/02/19   [provider]  traZODone (DESYREL) 50 MG tablet TAKE 1/2 TABLET(25 MG) BY MOUTH AT BEDTIME AS NEEDED FOR SLEEP 11/24/20   Dohmeier, Porfirio Mylar, MD    Allergies    Patient has no known allergies.  Review of Systems   Review of Systems  Constitutional:  Negative for appetite change.  HENT:  Negative for congestion.   Respiratory:  Negative for shortness of breath.   Cardiovascular:  Negative for chest pain.  Gastrointestinal:  Negative for abdominal pain.  Genitourinary:  Negative for flank pain.  Musculoskeletal:  Negative for back pain.  Skin:  Negative for rash.  Neurological:  Positive for dizziness and light-headedness.  Psychiatric/Behavioral:  Negative for confusion.    Physical Exam Updated Vital Signs BP 135/84 (BP Location: Right Arm)   Pulse 69   Temp 97.9 F (36.6 C) (Oral)   Resp 16   Ht 6\' 2"  (1.88 m)   Wt 102.1 kg   SpO2 98%   BMI 28.89 kg/m   Physical Exam Vitals and nursing note reviewed.  HENT:     Head: Atraumatic.  Eyes:  Pupils: Pupils are equal, round, and reactive to light.  Cardiovascular:     Rate and Rhythm: Regular rhythm.  Abdominal:     Tenderness: There is no abdominal tenderness.  Musculoskeletal:        General: No tenderness.     Cervical back: Neck supple.  Skin:    General: Skin is warm.  Neurological:     General: No focal deficit present.     Mental Status: He is alert and oriented to person, place, and time.    ED Results / Procedures / Treatments   Labs (all labs ordered are listed, but only abnormal results are displayed) Labs Reviewed  COMPREHENSIVE METABOLIC PANEL - Abnormal; Notable for the following components:      Result Value   Glucose, Bld 118 (*)    All other components  within normal limits  CBC WITH DIFFERENTIAL/PLATELET  MAGNESIUM    EKG EKG Interpretation  Date/Time:  Monday June 21 2021 08:39:41 EST Ventricular Rate:  84 PR Interval:  160 QRS Duration: 90 QT Interval:  364 QTC Calculation: 430 R Axis:   7 Text Interpretation: Normal sinus rhythm Left ventricular hypertrophy with repolarization abnormality ( R in aVL ) Abnormal ECG Confirmed by Benjiman Core [phone]) on 06/21/2021 12:54:15 PM  Radiology No results found.  Procedures Procedures   Medications Ordered in ED Medications - No data to display  ED Course  I have reviewed the triage vital signs and the nursing notes.  Pertinent labs & imaging results that were available during my care of the patient were reviewed by me and considered in my medical decision making (see chart for details).    MDM Rules/Calculators/A&P                           Patient with chronic lightheadedness/dizziness.  Does have somewhat of a movement component to it.  Slight nystagmus.  Has had extensive work-up however without clear cause.  Prescription given for Antivert which does not seem as if he was used in the past.  Follow-up with PCP.  No acute changes. Final Clinical Impression(s) / ED Diagnoses Final diagnoses:  Dizziness    Rx / DC Orders ED Discharge Orders          Ordered    meclizine (ANTIVERT) 12.5 MG tablet  3 times daily PRN        06/21/21 1345             Benjiman Core, MD 06/29/21 0018

## 2022-04-25 ENCOUNTER — Ambulatory Visit: Payer: Medicare Other | Admitting: Podiatry

## 2022-04-26 ENCOUNTER — Encounter: Payer: Self-pay | Admitting: Podiatry

## 2022-04-26 ENCOUNTER — Ambulatory Visit: Payer: Medicare Other | Admitting: Podiatry

## 2022-04-26 DIAGNOSIS — G609 Hereditary and idiopathic neuropathy, unspecified: Secondary | ICD-10-CM | POA: Diagnosis not present

## 2022-04-26 MED ORDER — GABAPENTIN 300 MG PO CAPS
300.0000 mg | ORAL_CAPSULE | Freq: Every day | ORAL | 3 refills | Status: AC
Start: 2022-04-26 — End: 2023-04-21

## 2022-04-27 LAB — B12 AND FOLATE PANEL
Folate: 9.2 ng/mL (ref >3.0)
Vitamin B-12: 500 pg/mL (ref 232–1245)

## 2022-04-29 ENCOUNTER — Encounter: Payer: Self-pay | Admitting: Podiatry

## 2022-04-29 NOTE — Progress Notes (Signed)
  Subjective:  Patient ID: Charles Munoz, male    DOB: 10/22/1945,  MRN: [id number]  Chief Complaint  Patient presents with   Toe Pain    New Patient - all of his toes hurt x 1 month, burning stinging across the tops of his toes. PCP gave him some kind of salve but it hasn't helped    76 y.o. male presents with the above complaint. History confirmed with patient.  Says he is prediabetic and has never been in the full diabetes range he takes metformin.  Has not been any neuropathic medications  Objective:  Physical Exam: warm, good capillary refill, no trophic changes or ulcerative lesions, normal DP and PT pulses, normal monofilament exam, and subjective paresthesias in the tips of the toes  Assessment:   1. Idiopathic peripheral neuropathy      Plan:  Patient was evaluated and treated and all questions answered.  Discussed with him that most of his symptoms seem to be consistent with a peripheral neuropathy.  We discussed the multiple etiologies and treatment options for these.  He has no history of uncontrolled diabetes.  Does not drink significant amounts of alcohol and no history of HIV or other diseases that may cause this.  Suspect it is likely idiopathic peripheral neuropathy.  I did recommend checking his B12 and folate level which was normal.  We discussed Intermatic treatment with medications.  He has used OTCs and topicals without relief.  We discussed prescription medication use.  I prescribed him gabapentin 300 mg to begin nightly.  We discussed the risk and possible side effects of this.  If benefiting him without side effects could increase to a second dose during the day.  He will return to see me as needed if it does not improve or worsens  Return if symptoms worsen or fail to improve.

## 2023-07-28 IMAGING — CT CT HEAD W/O CM
4 series · 17 of 47 positions shown, 19 images · non-contrast
Comparison: 03/15/2019, 08/30/2020

CLINICAL DATA: Disequilibrium with ambulation, lightheadedness

EXAM:
CT HEAD WITHOUT CONTRAST
TECHNIQUE: Contiguous axial images were obtained from the base of the skull
through the vertex without intravenous contrast.

[Series 3: head without · axial · non-contrast · 0.48mm/px · z∈[-10,+120]mm · 7 of 36 slices shown, 9 images]
[im 5/36  brain]
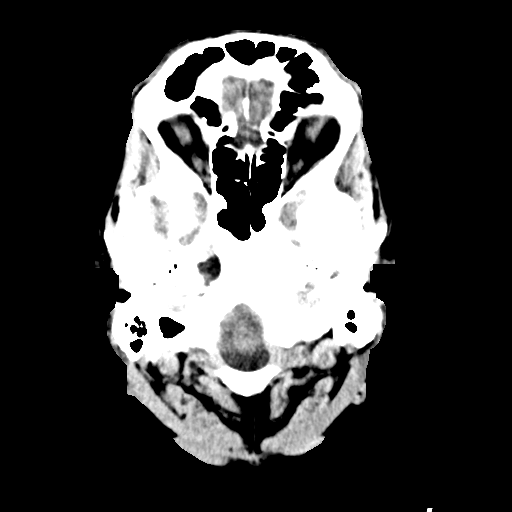
[im 5/36  bone]
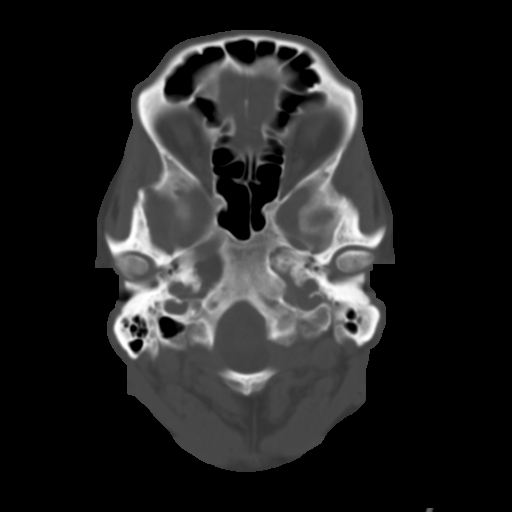
[im 9/36  brain]
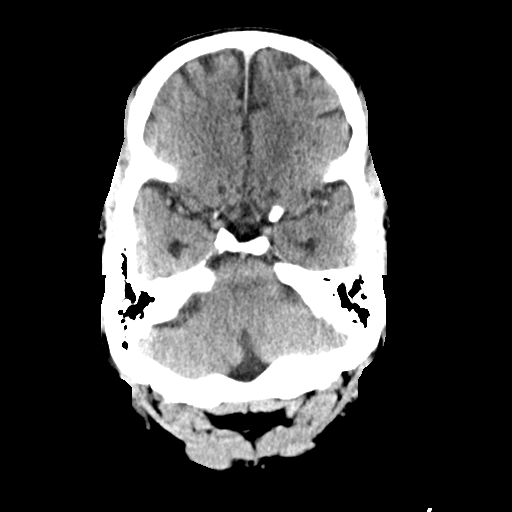
[im 14/36  brain]
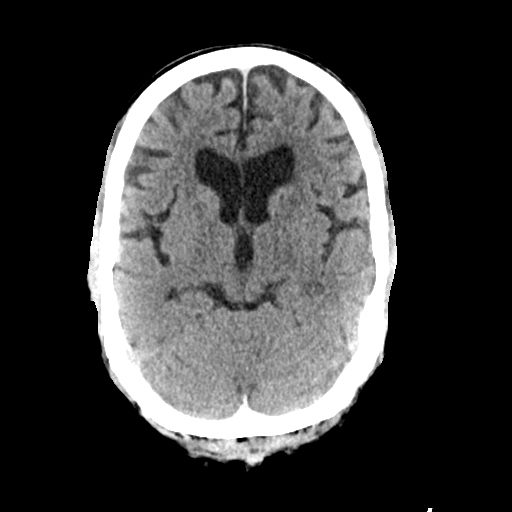
[im 18/36  brain]
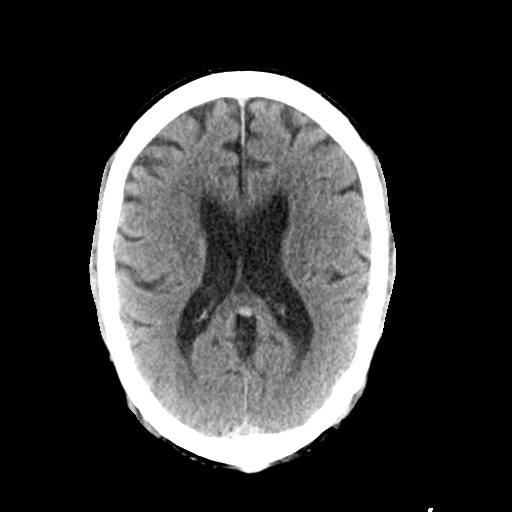
[im 22/36  brain]
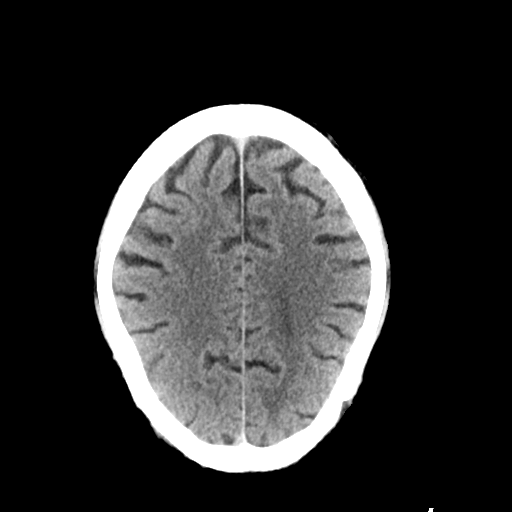
[im 22/36  bone]
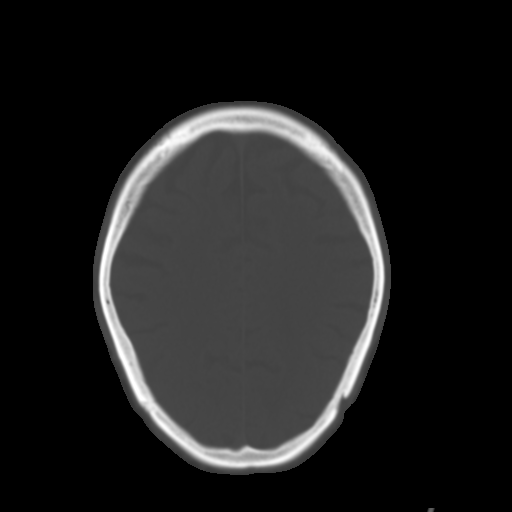
[im 27/36  brain]
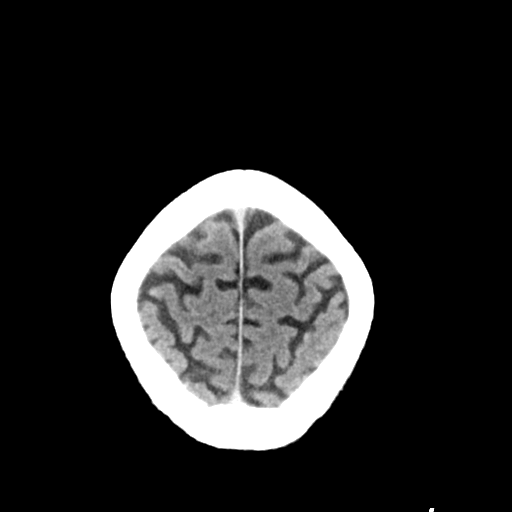
[im 31/36  brain]
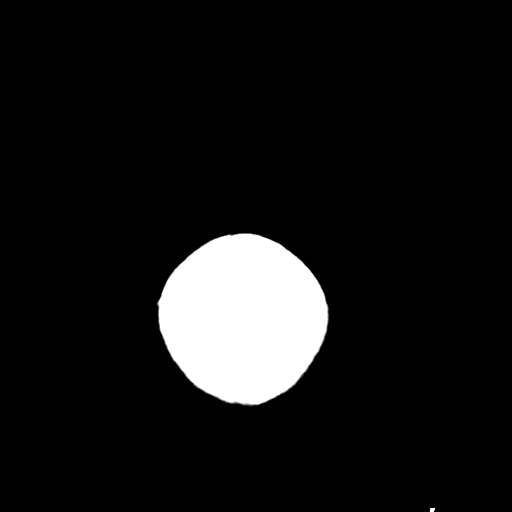

[Series 4: head bone · axial · 0.48mm/px · z∈[-14,+48]mm · 4 of 88 slices shown]
[im 9/88  bone]
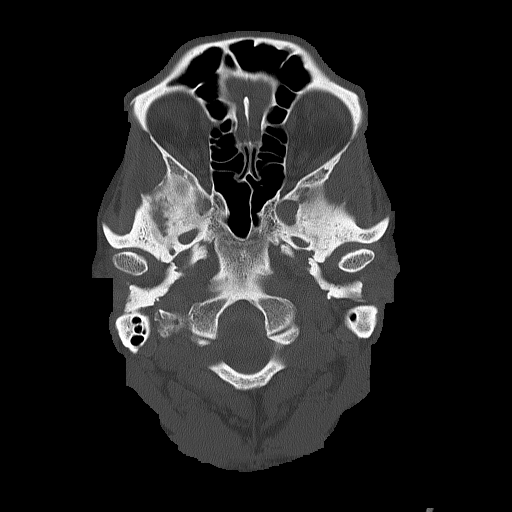
[im 18/88  bone]
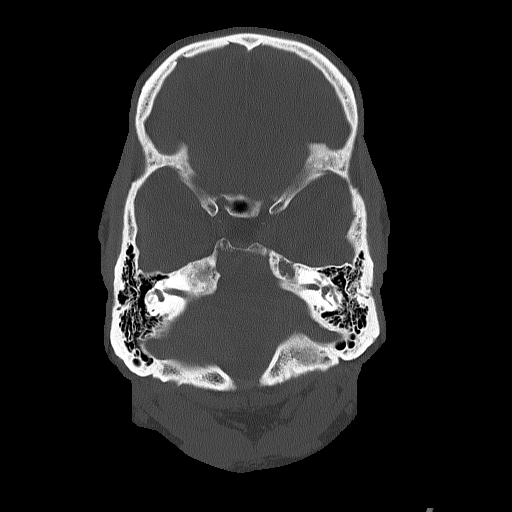
[im 27/88  bone]
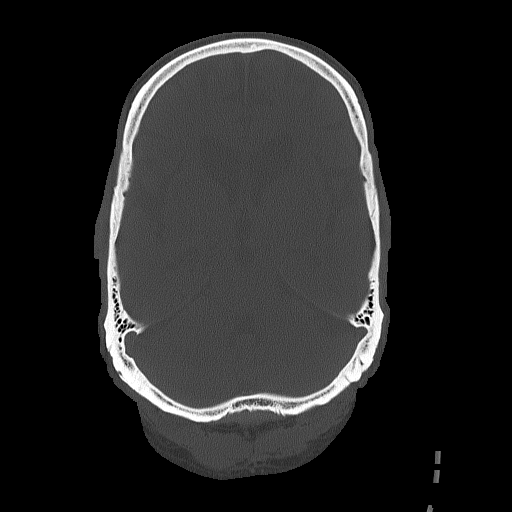
[im 40/88  bone]
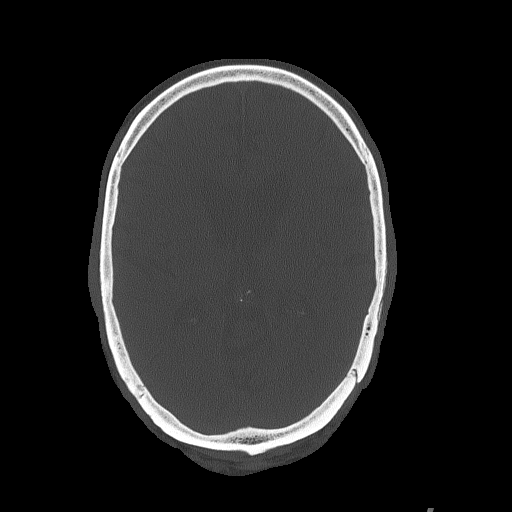

[Series 5: head without cor · coronal · non-contrast · 0.32mm/px · 3 of 76 slices shown]
[im 26/76  brain]
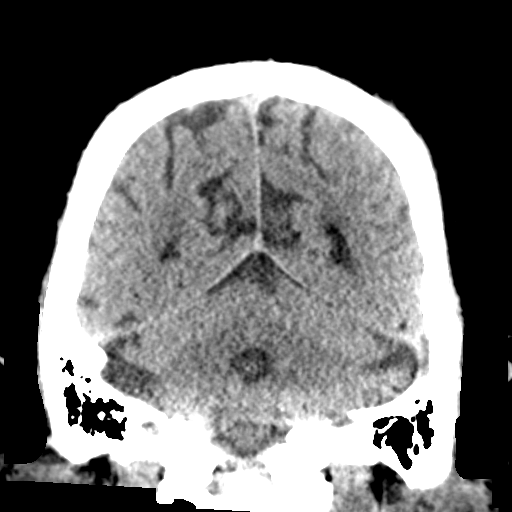
[im 34/76  brain]
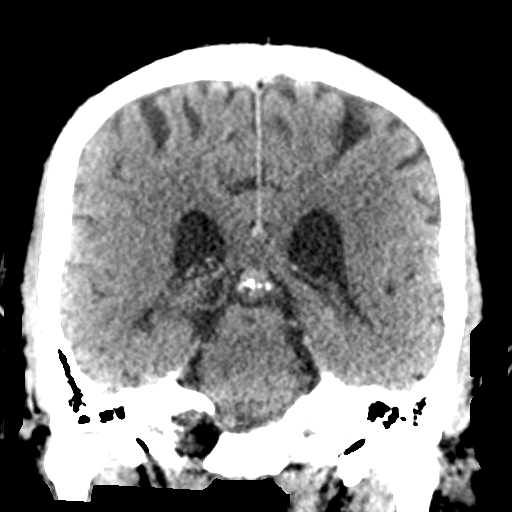
[im 42/76  brain]
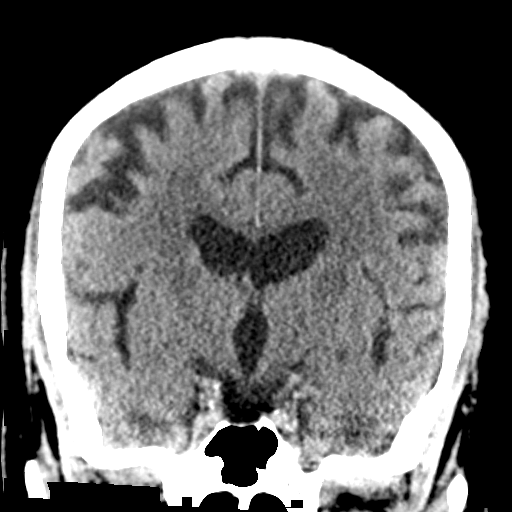

[Series 6: head without sag · sagittal · non-contrast · 0.31mm/px · 3 of 58 slices shown]
[im 20/58  brain]
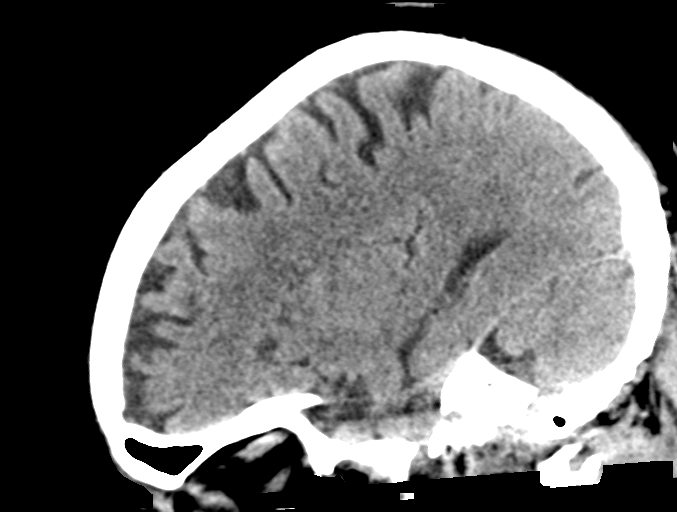
[im 29/58  brain]
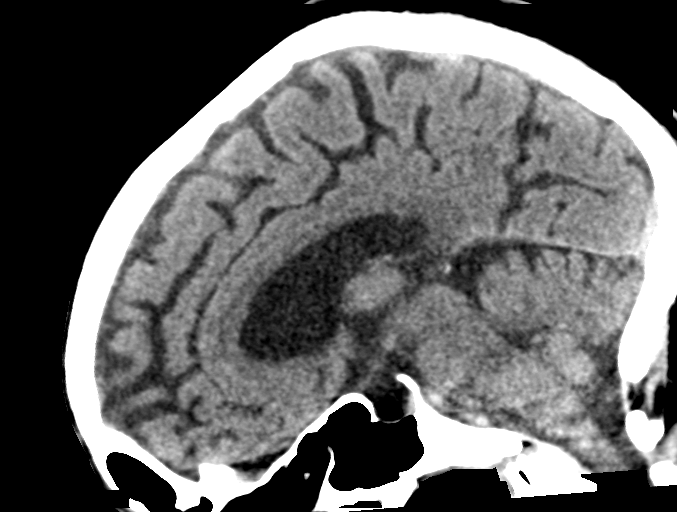
[im 39/58  brain]
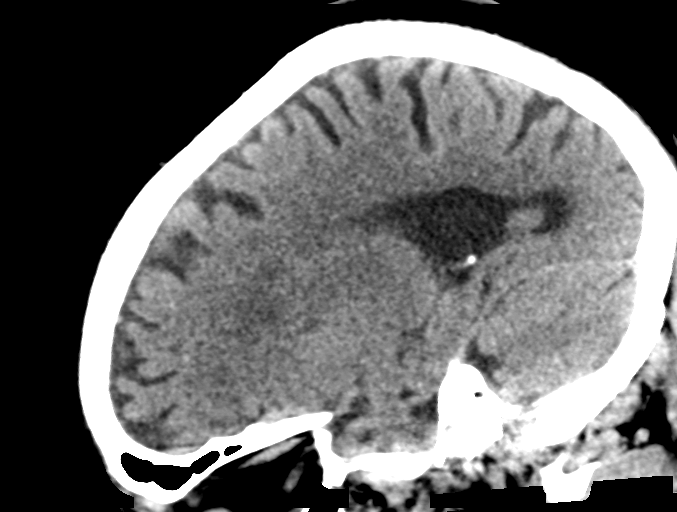

[17 of 47 positions shown; findings below may reference images not displayed]

FINDINGS: Brain: No evidence of acute infarction, hemorrhage, hydrocephalus,
extra-axial collection or mass lesion/mass effect.

Vascular: No hyperdense vessel or unexpected calcification.

Skull: Normal. Negative for fracture or focal lesion.

Sinuses/Orbits: No acute finding.

Other: None.
IMPRESSION: No acute intracranial findings.

## 2024-02-20 ENCOUNTER — Encounter (HOSPITAL_COMMUNITY): Payer: Self-pay | Admitting: *Deleted

## 2024-02-20 ENCOUNTER — Other Ambulatory Visit: Payer: Self-pay

## 2024-02-20 ENCOUNTER — Emergency Department (HOSPITAL_COMMUNITY)
Admission: EM | Admit: 2024-02-20 | Discharge: 2024-02-20 | Disposition: A | Discharge status: Home / Self Care | Attending: Emergency Medicine | Admitting: Emergency Medicine

## 2024-02-20 DIAGNOSIS — Z87891 Personal history of nicotine dependence: Secondary | ICD-10-CM | POA: Insufficient documentation

## 2024-02-20 DIAGNOSIS — I10 Essential (primary) hypertension: Secondary | ICD-10-CM | POA: Diagnosis not present

## 2024-02-20 DIAGNOSIS — R42 Dizziness and giddiness: Secondary | ICD-10-CM | POA: Insufficient documentation

## 2024-02-20 DIAGNOSIS — Z79899 Other long term (current) drug therapy: Secondary | ICD-10-CM | POA: Diagnosis not present

## 2024-02-20 MED ORDER — MECLIZINE HCL 25 MG PO TABS: 25.0000 mg | ORAL_TABLET | Freq: Three times a day (TID) | ORAL | 0 refills | Status: AC | PRN

## 2024-02-20 NOTE — ED Triage Notes (Signed)
 Here by POV from home for dizziness. Ongoing for 4-5 years. Reports no change. Denies other sx. States, just thought today was the day to finally get it checked out. Steady gait.

## 2024-02-20 NOTE — Discharge Instructions (Signed)
 We evaluated you for your dizziness. Your examination was reassuring. Please follow up with ENT since you have already had a reassuring neurologic workup including MRI of your brain. We have prescribed you a medicine called meclizine  which can help with dizziness. Please return for any new or worsening symptoms such as weakness, numbness, tingling, facial droop, trouble speaking or swallowing, chest pain, headaches, or any other new symptoms.

## 2024-02-20 NOTE — ED Provider Notes (Signed)
 [facility] AT [facility] Provider Note  CSN: [id number] Arrival date & time: 02/20/24 9188  Chief Complaint(s) Dizziness  HPI Charles Munoz is a 78 y.o. male history of hypertension, hyperlipidemia presenting with dizziness.  Patient reports dizziness for 4 to 5 years.  He reports that this is completely chronic.  He denies any change in his symptoms.  He reports that he has had previous testing including an MRI which was negative.  He does not take any medicine for his dizziness.  He reports that he came today just because he felt like he wanted to be evaluated and see if there is any medicine he can get for it.  He denies any numbness, tingling, headaches, weakness, clumsiness, speech changes, facial droop, trouble swallowing, headaches, hearing changes, chest pain, shortness of breath, abdominal pain, leg swelling.  Describes the dizziness as a sensation of being off balance when standing.  No sensation at rest.   Past Medical History History reviewed. No pertinent past medical history. Patient Active Problem List   Diagnosis Date Noted   Intermittent lightheadedness 08/20/2020   Gait instability 08/20/2020   Idiopathic peripheral neuropathy 08/20/2020   Chronic insomnia 08/20/2020   HTN (hypertension) 01/30/2012   Home Medication(s) Prior to Admission medications   Medication Sig Start Date End Date Taking? Authorizing Provider  meclizine  (ANTIVERT ) 25 MG tablet Take 1 tablet (25 mg total) by mouth 3 (three) times daily as needed for dizziness. 02/20/24  Yes Francesca Elsie CROME, MD  diclofenac Sodium (VOLTAREN) 1 % GEL APPLY TO FEET THREE TIMES DAILY AS DIRECTED 03/16/22   [provider]  diltiazem (CARDIZEM CD) 120 MG 24 hr capsule Take 120 mg by mouth daily. 05/28/20   [provider]  gabapentin  (NEURONTIN ) 300 MG capsule Take 1 capsule (300 mg total) by mouth at bedtime. 04/26/22 04/21/23  McDonald, Juliene SAUNDERS, DPM  glimepiride (AMARYL)  2 MG tablet Take 2 mg by mouth daily. 03/16/22   [provider]  levothyroxine (SYNTHROID) 50 MCG tablet Take 50 mcg by mouth daily. 05/28/20   [provider]  lisinopril (ZESTRIL) 5 MG tablet Take 5 mg by mouth daily. 01/02/19   [provider]  metFORMIN (GLUCOPHAGE) 500 MG tablet Take 500 mg by mouth daily. 01/02/19   [provider]  rosuvastatin (CRESTOR) 10 MG tablet Take 10 mg by mouth daily. 03/16/22   [provider]  sildenafil (REVATIO) 20 MG tablet Take 20 mg by mouth daily as needed (ED).  09/30/18   [provider]  simvastatin (ZOCOR) 20 MG tablet Take 20 mg by mouth daily. 01/02/19   [provider]  traZODone  (DESYREL ) 50 MG tablet TAKE 1/2 TABLET(25 MG) BY MOUTH AT BEDTIME AS NEEDED FOR SLEEP 11/24/20   Dohmeier, Dedra, MD  Past Surgical History History reviewed. No pertinent surgical history. Family History Family History  Problem Relation Age of Onset   Migraines Sister     Social History Social History   Tobacco Use   Smoking status: Former    Current packs/day: 0.00    Types: Cigarettes    Quit date: 2001    Years since quitting: 24.6   Smokeless tobacco: Never   Tobacco comments:    Quit 20years ago  Substance Use Topics   Alcohol use: Yes    Comment: he will have some drinks on the week   Drug use: Not Currently   Allergies Patient has no known allergies.  Review of Systems Review of Systems  All other systems reviewed and are negative.   Physical Exam Vital Signs  I have reviewed the triage vital signs BP (!) 166/86   Pulse 99   Temp 97.8 F (36.6 C) (Oral)   Resp 17   Wt 102.1 kg   SpO2 99%   BMI 28.89 kg/m  Physical Exam Vitals and nursing note reviewed.  Constitutional:      General: He is not in acute distress.    Appearance: Normal appearance.   HENT:     Mouth/Throat:     Mouth: Mucous membranes are moist.  Eyes:     Conjunctiva/sclera: Conjunctivae normal.  Cardiovascular:     Rate and Rhythm: Normal rate and regular rhythm.  Pulmonary:     Effort: Pulmonary effort is normal. No respiratory distress.     Breath sounds: Normal breath sounds.  Abdominal:     General: Abdomen is flat.     Palpations: Abdomen is soft.     Tenderness: There is no abdominal tenderness.  Musculoskeletal:     Right lower leg: No edema.     Left lower leg: No edema.  Skin:    General: Skin is warm and dry.     Capillary Refill: Capillary refill takes less than 2 seconds.  Neurological:     Mental Status: He is alert and oriented to person, place, and time. Mental status is at baseline.     Comments: Cranial nerves II through XII intact, strength 5 out of 5 in the bilateral upper and lower extremities, no sensory deficit to light touch, no dysmetria on finger-nose-finger testing, ambulatory with steady gait.  Psychiatric:        Mood and Affect: Mood normal.        Behavior: Behavior normal.     ED Results and Treatments Labs (all labs ordered are listed, but only abnormal results are displayed) Labs Reviewed - No data to display                                                                                                                        Radiology No results found.  Pertinent labs & imaging results that were available during my care of the patient were reviewed by me and considered in my medical decision making (  see MDM for details).  Medications Ordered in ED Medications - No data to display                                                                                                                                   Procedures Procedures  (including critical care time)  Medical Decision Making / ED Course   MDM:  77 year old presenting to the emergency department with dizziness.  He reports his symptoms are  completely chronic.  His neurologic exam is reassuring.  His gait is normal.  Reviewed prior workup including previous MRI which did not show sign of a stroke.  This was a few years ago however he reports his symptoms are completely unchanged over the past 4 to 5 years.  He denies any other concerning symptoms.  I do not think there is any indication for further neuroimaging or laboratory testing given that he reports his symptoms are completely chronic and unchanged.  He has previously follow-up with neurology, recommended that he try to follow-up with ENT as he has not previously seen them for dizziness.  Will prescribe meclizine .  Discussed safe use of this medicine in elderly patient including avoiding driving and being careful to avoid falls.  Will discharge patient to home. All questions answered. Patient comfortable with plan of discharge. Return precautions discussed with patient and specified on the after visit summary.       Additional history obtained:  -External records from outside source obtained and reviewed including: Chart review including previous notes, labs, imaging, consultation notes including prior neurology notes and MRI     Medicines ordered and prescription drug management: Meds ordered this encounter  Medications   meclizine  (ANTIVERT ) 25 MG tablet    Sig: Take 1 tablet (25 mg total) by mouth 3 (three) times daily as needed for dizziness.    Dispense:  30 tablet    Refill:  0    -I have reviewed the patients home medicines and have made adjustments as needed  Co morbidities that complicate the patient evaluation History reviewed. No pertinent past medical history.    Dispostion: Disposition decision including need for hospitalization was considered, and patient discharged from emergency department.    Final Clinical Impression(s) / ED Diagnoses Final diagnoses:  Dizziness     This chart was dictated using voice recognition software.  Despite best  efforts to proofread,  errors can occur which can change the documentation meaning.    Francesca Elsie CROME, MD 02/20/24 [phone]
